# Patient Record
Sex: Female | Born: 1955 | Race: White | Hispanic: No | Marital: Married | Smoking: Former smoker
Health system: Southern US, Community
[De-identification: ages and names within clinical notes are randomized; demographics above are authoritative.]

## PROBLEM LIST (undated history)

## (undated) DIAGNOSIS — N951 Menopausal and female climacteric states: Secondary | ICD-10-CM

## (undated) DIAGNOSIS — C569 Malignant neoplasm of unspecified ovary: Secondary | ICD-10-CM

## (undated) DIAGNOSIS — N852 Hypertrophy of uterus: Secondary | ICD-10-CM

## (undated) DIAGNOSIS — D219 Benign neoplasm of connective and other soft tissue, unspecified: Secondary | ICD-10-CM

## (undated) DIAGNOSIS — T7840XA Allergy, unspecified, initial encounter: Secondary | ICD-10-CM

## (undated) DIAGNOSIS — K649 Unspecified hemorrhoids: Secondary | ICD-10-CM

## (undated) DIAGNOSIS — K5900 Constipation, unspecified: Secondary | ICD-10-CM

## (undated) DIAGNOSIS — K635 Polyp of colon: Secondary | ICD-10-CM

## (undated) HISTORY — DX: Polyp of colon: K63.5

## (undated) HISTORY — PX: POLYPECTOMY: SHX149

## (undated) HISTORY — PX: DILATION AND CURETTAGE OF UTERUS: SHX78

## (undated) HISTORY — DX: Hypertrophy of uterus: N85.2

## (undated) HISTORY — DX: Constipation, unspecified: K59.00

## (undated) HISTORY — DX: Benign neoplasm of connective and other soft tissue, unspecified: D21.9

## (undated) HISTORY — DX: Menopausal and female climacteric states: N95.1

## (undated) HISTORY — DX: Malignant neoplasm of unspecified ovary: C56.9

## (undated) HISTORY — PX: HAND SURGERY: SHX662

## (undated) HISTORY — DX: Unspecified hemorrhoids: K64.9

## (undated) HISTORY — PX: BREAST LUMPECTOMY: SHX2

## (undated) HISTORY — PX: ENDOMETRIAL ABLATION: SHX621

## (undated) HISTORY — PX: TONSILLECTOMY: SUR1361

## (undated) HISTORY — DX: Allergy, unspecified, initial encounter: T78.40XA

## (undated) HISTORY — PX: ABDOMINAL HYSTERECTOMY: SHX81

## (undated) HISTORY — PX: COLONOSCOPY: SHX174

---

## 2011-08-17 ENCOUNTER — Ambulatory Visit: Payer: Self-pay | Admitting: Obstetrics & Gynecology

## 2011-08-24 ENCOUNTER — Ambulatory Visit (INDEPENDENT_AMBULATORY_CARE_PROVIDER_SITE_OTHER): Payer: BC Managed Care – PPO | Admitting: Obstetrics & Gynecology

## 2011-08-24 ENCOUNTER — Encounter: Payer: Self-pay | Admitting: Obstetrics & Gynecology

## 2011-08-24 VITALS — BP 133/83 | HR 77 | Resp 18 | Ht 64.0 in | Wt 164.0 lb

## 2011-08-24 DIAGNOSIS — Z124 Encounter for screening for malignant neoplasm of cervix: Secondary | ICD-10-CM

## 2011-08-24 DIAGNOSIS — Z01419 Encounter for gynecological examination (general) (routine) without abnormal findings: Secondary | ICD-10-CM

## 2011-08-24 DIAGNOSIS — Z79890 Hormone replacement therapy (postmenopausal): Secondary | ICD-10-CM | POA: Insufficient documentation

## 2011-08-24 MED ORDER — ESTRADIOL 0.1 MG/24HR TD PTTW: 1.0000 | MEDICATED_PATCH | TRANSDERMAL | Status: DC

## 2011-08-24 MED ORDER — PROGESTERONE MICRONIZED 100 MG PO CAPS: 100.0000 mg | ORAL_CAPSULE | Freq: Every day | ORAL | Status: DC

## 2011-08-24 NOTE — Patient Instructions (Signed)
Preventive Care for Adults, Female A healthy lifestyle and preventive care can promote health and wellness. Preventive health guidelines for women include the following key practices.  A routine yearly physical is a good way to check with your caregiver about your health and preventive screening. It is a chance to share any concerns and updates on your health, and to receive a thorough exam.   Visit your dentist for a routine exam and preventive care every 6 months. Brush your teeth twice a day and floss once a day. Good oral hygiene prevents tooth decay and gum disease.   The frequency of eye exams is based on your age, health, family medical history, use of contact lenses, and other factors. Follow your caregiver's recommendations for frequency of eye exams.   Eat a healthy diet. Foods like vegetables, fruits, whole grains, low-fat dairy products, and lean protein foods contain the nutrients you need without too many calories. Decrease your intake of foods high in solid fats, added sugars, and salt. Eat the right amount of calories for you.Get information about a proper diet from your caregiver, if necessary.   Regular physical exercise is one of the most important things you can do for your health. Most adults should get at least 150 minutes of moderate-intensity exercise (any activity that increases your heart rate and causes you to sweat) each week. In addition, most adults need muscle-strengthening exercises on 2 or more days a week.   Maintain a healthy weight. The body mass index (BMI) is a screening tool to identify possible weight problems. It provides an estimate of body fat based on height and weight. Your caregiver can help determine your BMI, and can help you achieve or maintain a healthy weight.For adults 20 years and older:   A BMI below 18.5 is considered underweight.   A BMI of 18.5 to 24.9 is normal.   A BMI of 25 to 29.9 is considered overweight.   A BMI of 30 and above is  considered obese.   Maintain normal blood lipids and cholesterol levels by exercising and minimizing your intake of saturated fat. Eat a balanced diet with plenty of fruit and vegetables. Blood tests for lipids and cholesterol should begin at age 20 and be repeated every 5 years. If your lipid or cholesterol levels are high, you are over 50, or you are at high risk for heart disease, you may need your cholesterol levels checked more frequently.Ongoing high lipid and cholesterol levels should be treated with medicines if diet and exercise are not effective.   If you smoke, find out from your caregiver how to quit. If you do not use tobacco, do not start.   If you are pregnant, do not drink alcohol. If you are breastfeeding, be very cautious about drinking alcohol. If you are not pregnant and choose to drink alcohol, do not exceed 1 drink per day. One drink is considered to be 12 ounces (355 mL) of beer, 5 ounces (148 mL) of wine, or 1.5 ounces (44 mL) of liquor.   Avoid use of street drugs. Do not share needles with anyone. Ask for help if you need support or instructions about stopping the use of drugs.   High blood pressure causes heart disease and increases the risk of stroke. Your blood pressure should be checked at least every 1 to 2 years. Ongoing high blood pressure should be treated with medicines if weight loss and exercise are not effective.   If you are 55 to 54   years old, ask your caregiver if you should take aspirin to prevent strokes.   Diabetes screening involves taking a blood sample to check your fasting blood sugar level. This should be done once every 3 years, after age 45, if you are within normal weight and without risk factors for diabetes. Testing should be considered at a younger age or be carried out more frequently if you are overweight and have at least 1 risk factor for diabetes.   Breast cancer screening is essential preventive care for women. You should practice "breast  self-awareness." This means understanding the normal appearance and feel of your breasts and may include breast self-examination. Any changes detected, no matter how small, should be reported to a caregiver. Women in their 20s and 30s should have a clinical breast exam (CBE) by a caregiver as part of a regular health exam every 1 to 3 years. After age 40, women should have a CBE every year. Starting at age 40, women should consider having a mammography (breast X-ray test) every year. Women who have a family history of breast cancer should talk to their caregiver about genetic screening. Women at a high risk of breast cancer should talk to their caregivers about having magnetic resonance imaging (MRI) and a mammography every year.   The Pap test is a screening test for cervical cancer. A Pap test can show cell changes on the cervix that might become cervical cancer if left untreated. A Pap test is a procedure in which cells are obtained and examined from the lower end of the uterus (cervix).   Women should have a Pap test starting at age 21.   Between ages 21 and 29, Pap tests should be repeated every 2 years.   Beginning at age 30, you should have a Pap test every 3 years as long as the past 3 Pap tests have been normal.   Some women have medical problems that increase the chance of getting cervical cancer. Talk to your caregiver about these problems. It is especially important to talk to your caregiver if a new problem develops soon after your last Pap test. In these cases, your caregiver may recommend more frequent screening and Pap tests.   The above recommendations are the same for women who have or have not gotten the vaccine for human papillomavirus (HPV).   If you had a hysterectomy for a problem that was not cancer or a condition that could lead to cancer, then you no longer need Pap tests. Even if you no longer need a Pap test, a regular exam is a good idea to make sure no other problems are  starting.   If you are between ages 65 and 70, and you have had normal Pap tests going back 10 years, you no longer need Pap tests. Even if you no longer need a Pap test, a regular exam is a good idea to make sure no other problems are starting.   If you have had past treatment for cervical cancer or a condition that could lead to cancer, you need Pap tests and screening for cancer for at least 20 years after your treatment.   If Pap tests have been discontinued, risk factors (such as a new sexual partner) need to be reassessed to determine if screening should be resumed.   The HPV test is an additional test that may be used for cervical cancer screening. The HPV test looks for the virus that can cause the cell changes on the cervix.   The cells collected during the Pap test can be tested for HPV. The HPV test could be used to screen women aged 30 years and older, and should be used in women of any age who have unclear Pap test results. After the age of 30, women should have HPV testing at the same frequency as a Pap test.   Colorectal cancer can be detected and often prevented. Most routine colorectal cancer screening begins at the age of 50 and continues through age 75. However, your caregiver may recommend screening at an earlier age if you have risk factors for colon cancer. On a yearly basis, your caregiver may provide home test kits to check for hidden blood in the stool. Use of a small camera at the end of a tube, to directly examine the colon (sigmoidoscopy or colonoscopy), can detect the earliest forms of colorectal cancer. Talk to your caregiver about this at age 50, when routine screening begins. Direct examination of the colon should be repeated every 5 to 10 years through age 75, unless early forms of pre-cancerous polyps or small growths are found.   Hepatitis C blood testing is recommended for all people born from 1945 through 1965 and any individual with known risks for hepatitis C.    Practice safe sex. Use condoms and avoid high-risk sexual practices to reduce the spread of sexually transmitted infections (STIs). STIs include gonorrhea, chlamydia, syphilis, trichomonas, herpes, HPV, and human immunodeficiency virus (HIV). Herpes, HIV, and HPV are viral illnesses that have no cure. They can result in disability, cancer, and death. Sexually active women aged 25 and younger should be checked for chlamydia. Older women with new or multiple partners should also be tested for chlamydia. Testing for other STIs is recommended if you are sexually active and at increased risk.   Osteoporosis is a disease in which the bones lose minerals and strength with aging. This can result in serious bone fractures. The risk of osteoporosis can be identified using a bone density scan. Women ages 65 and over and women at risk for fractures or osteoporosis should discuss screening with their caregivers. Ask your caregiver whether you should take a calcium supplement or vitamin D to reduce the rate of osteoporosis.   Menopause can be associated with physical symptoms and risks. Hormone replacement therapy is available to decrease symptoms and risks. You should talk to your caregiver about whether hormone replacement therapy is right for you.   Use sunscreen with sun protection factor (SPF) of 30 or more. Apply sunscreen liberally and repeatedly throughout the day. You should seek shade when your shadow is shorter than you. Protect yourself by wearing long sleeves, pants, a wide-brimmed hat, and sunglasses year round, whenever you are outdoors.   Once a month, do a whole body skin exam, using a mirror to look at the skin on your back. Notify your caregiver of new moles, moles that have irregular borders, moles that are larger than a pencil eraser, or moles that have changed in shape or color.   Stay current with required immunizations.   Influenza. You need a dose every fall (or winter). The composition of  the flu vaccine changes each year, so being vaccinated once is not enough.   Pneumococcal polysaccharide. You need 1 to 2 doses if you smoke cigarettes or if you have certain chronic medical conditions. You need 1 dose at age 65 (or older) if you have never been vaccinated.   Tetanus, diphtheria, pertussis (Tdap, Td). Get 1 dose of   Tdap vaccine if you are younger than age 65, are over 65 and have contact with an infant, are a healthcare worker, are pregnant, or simply want to be protected from whooping cough. After that, you need a Td booster dose every 10 years. Consult your caregiver if you have not had at least 3 tetanus and diphtheria-containing shots sometime in your life or have a deep or dirty wound.   HPV. You need this vaccine if you are a woman age 26 or younger. The vaccine is given in 3 doses over 6 months.   Measles, mumps, rubella (MMR). You need at least 1 dose of MMR if you were born in 1957 or later. You may also need a second dose.   Meningococcal. If you are age 19 to 21 and a first-year college student living in a residence hall, or have one of several medical conditions, you need to get vaccinated against meningococcal disease. You may also need additional booster doses.   Zoster (shingles). If you are age 60 or older, you should get this vaccine.   Varicella (chickenpox). If you have never had chickenpox or you were vaccinated but received only 1 dose, talk to your caregiver to find out if you need this vaccine.   Hepatitis A. You need this vaccine if you have a specific risk factor for hepatitis A virus infection or you simply wish to be protected from this disease. The vaccine is usually given as 2 doses, 6 to 18 months apart.   Hepatitis B. You need this vaccine if you have a specific risk factor for hepatitis B virus infection or you simply wish to be protected from this disease. The vaccine is given in 3 doses, usually over 6 months.  Preventive Services /  Frequency Ages 19 to 39  Blood pressure check.** / Every 1 to 2 years.   Lipid and cholesterol check.** / Every 5 years beginning at age 20.   Clinical breast exam.** / Every 3 years for women in their 20s and 30s.   Pap test.** / Every 2 years from ages 21 through 29. Every 3 years starting at age 30 through age 65 or 70 with a history of 3 consecutive normal Pap tests.   HPV screening.** / Every 3 years from ages 30 through ages 65 to 70 with a history of 3 consecutive normal Pap tests.   Hepatitis C blood test.** / For any individual with known risks for hepatitis C.   Skin self-exam. / Monthly.   Influenza immunization.** / Every year.   Pneumococcal polysaccharide immunization.** / 1 to 2 doses if you smoke cigarettes or if you have certain chronic medical conditions.   Tetanus, diphtheria, pertussis (Tdap, Td) immunization. / A one-time dose of Tdap vaccine. After that, you need a Td booster dose every 10 years.   HPV immunization. / 3 doses over 6 months, if you are 26 and younger.   Measles, mumps, rubella (MMR) immunization. / You need at least 1 dose of MMR if you were born in 1957 or later. You may also need a second dose.   Meningococcal immunization. / 1 dose if you are age 19 to 21 and a first-year college student living in a residence hall, or have one of several medical conditions, you need to get vaccinated against meningococcal disease. You may also need additional booster doses.   Varicella immunization.** / Consult your caregiver.   Hepatitis A immunization.** / Consult your caregiver. 2 doses, 6 to 18 months   apart.   Hepatitis B immunization.** / Consult your caregiver. 3 doses usually over 6 months.  Ages 40 to 64  Blood pressure check.** / Every 1 to 2 years.   Lipid and cholesterol check.** / Every 5 years beginning at age 20.   Clinical breast exam.** / Every year after age 40.   Mammogram.** / Every year beginning at age 40 and continuing for as  long as you are in good health. Consult with your caregiver.   Pap test.** / Every 3 years starting at age 30 through age 65 or 70 with a history of 3 consecutive normal Pap tests.   HPV screening.** / Every 3 years from ages 30 through ages 65 to 70 with a history of 3 consecutive normal Pap tests.   Fecal occult blood test (FOBT) of stool. / Every year beginning at age 50 and continuing until age 75. You may not need to do this test if you get a colonoscopy every 10 years.   Flexible sigmoidoscopy or colonoscopy.** / Every 5 years for a flexible sigmoidoscopy or every 10 years for a colonoscopy beginning at age 50 and continuing until age 75.   Hepatitis C blood test.** / For all people born from 1945 through 1965 and any individual with known risks for hepatitis C.   Skin self-exam. / Monthly.   Influenza immunization.** / Every year.   Pneumococcal polysaccharide immunization.** / 1 to 2 doses if you smoke cigarettes or if you have certain chronic medical conditions.   Tetanus, diphtheria, pertussis (Tdap, Td) immunization.** / A one-time dose of Tdap vaccine. After that, you need a Td booster dose every 10 years.   Measles, mumps, rubella (MMR) immunization. / You need at least 1 dose of MMR if you were born in 1957 or later. You may also need a second dose.   Varicella immunization.** / Consult your caregiver.   Meningococcal immunization.** / Consult your caregiver.   Hepatitis A immunization.** / Consult your caregiver. 2 doses, 6 to 18 months apart.   Hepatitis B immunization.** / Consult your caregiver. 3 doses, usually over 6 months.  Ages 65 and over  Blood pressure check.** / Every 1 to 2 years.   Lipid and cholesterol check.** / Every 5 years beginning at age 20.   Clinical breast exam.** / Every year after age 40.   Mammogram.** / Every year beginning at age 40 and continuing for as long as you are in good health. Consult with your caregiver.   Pap test.** /  Every 3 years starting at age 30 through age 65 or 70 with a 3 consecutive normal Pap tests. Testing can be stopped between 65 and 70 with 3 consecutive normal Pap tests and no abnormal Pap or HPV tests in the past 10 years.   HPV screening.** / Every 3 years from ages 30 through ages 65 or 70 with a history of 3 consecutive normal Pap tests. Testing can be stopped between 65 and 70 with 3 consecutive normal Pap tests and no abnormal Pap or HPV tests in the past 10 years.   Fecal occult blood test (FOBT) of stool. / Every year beginning at age 50 and continuing until age 75. You may not need to do this test if you get a colonoscopy every 10 years.   Flexible sigmoidoscopy or colonoscopy.** / Every 5 years for a flexible sigmoidoscopy or every 10 years for a colonoscopy beginning at age 50 and continuing until age 75.   Hepatitis   C blood test.** / For all people born from 1945 through 1965 and any individual with known risks for hepatitis C.   Osteoporosis screening.** / A one-time screening for women ages 65 and over and women at risk for fractures or osteoporosis.   Skin self-exam. / Monthly.   Influenza immunization.** / Every year.   Pneumococcal polysaccharide immunization.** / 1 dose at age 65 (or older) if you have never been vaccinated.   Tetanus, diphtheria, pertussis (Tdap, Td) immunization. / A one-time dose of Tdap vaccine if you are over 65 and have contact with an infant, are a healthcare worker, or simply want to be protected from whooping cough. After that, you need a Td booster dose every 10 years.   Varicella immunization.** / Consult your caregiver.   Meningococcal immunization.** / Consult your caregiver.   Hepatitis A immunization.** / Consult your caregiver. 2 doses, 6 to 18 months apart.   Hepatitis B immunization.** / Check with your caregiver. 3 doses, usually over 6 months.  ** Family history and personal history of risk and conditions may change your caregiver's  recommendations. Document Released: 04/18/2001 Document Revised: 02/09/2011 Document Reviewed: 07/18/2010 [employer] Patient Information 2012 [employer]. 

## 2011-08-24 NOTE — Progress Notes (Signed)
Yearly exam was going to be scheduled in [place] for a robotic hysterectomy.  She is still wanting to do this but is waiting until her insurance is better.  She needs refills of her hormone medication today.

## 2011-08-24 NOTE — Progress Notes (Signed)
  Subjective:     Kerri Whitaker is a 56 y.o. female and is here for a comprehensive gynecologic physical exam.  She just moved to the area from [place] and is initiating GYN care. The patient reports no problems, has a history of fibroids and DUB which was treated with endometrial ablation several years ago.  She is on Vivelle Dot 0.1mg /day and is satisfied with it for control of vasomotor symptoms. No abnormal bleeding, vaginal discharge, problems with intercourse or any other gynecologic concerns.  The following portions of the patient's history were reviewed and updated as appropriate: allergies, current medications, past family history, past medical history, past social history, past surgical history and problem list.  Review of Systems A comprehensive review of systems was negative.   Objective:   Blood pressure 133/83, pulse 77, resp. rate 18, height 5\' 4"  (1.626 m), weight 164 lb (74.39 kg). GENERAL: Well-developed, well-nourished female in no acute distress. Multiple nevi (has been evaluated by Dermatology in [place]) HEENT: Normocephalic, atraumatic. Sclerae anicteric.  NECK: Supple. Normal thyroid.  LUNGS: Clear to auscultation bilaterally.  HEART: Regular rate and rhythm. BREASTS: Symmetric with everted nipples. No masses, skin changes, nipple drainage, or lymphadenopathy. ABDOMEN: Soft, nontender, nondistended. No organomegaly. PELVIC: Normal external female genitalia. Vagina is pink and rugated.  Normal discharge. Normal cervix contour. Pap smear obtained. Uterus is enlarged about 14 week size. No adnexal mass or tenderness.  EXTREMITIES: No cyanosis, clubbing, or edema, 2+ distal pulses.    Assessment:    Healthy female exam. Satisfied on Vivelle Dot    Plan:    Follow up pap Mammogram to be scheduled later Emphasized routine preventative health maintenance measures  HRT refilled

## 2012-04-15 ENCOUNTER — Telehealth: Payer: Self-pay | Admitting: *Deleted

## 2012-04-15 NOTE — Telephone Encounter (Signed)
Pharmacy called for a 3 month refill of medications.  OK to refill patient is not due to follow up until June.

## 2012-08-30 ENCOUNTER — Encounter: Payer: Self-pay | Admitting: Obstetrics & Gynecology

## 2012-08-30 ENCOUNTER — Encounter: Payer: Self-pay | Admitting: Internal Medicine

## 2012-08-30 ENCOUNTER — Ambulatory Visit (INDEPENDENT_AMBULATORY_CARE_PROVIDER_SITE_OTHER): Payer: Managed Care, Other (non HMO) | Admitting: Obstetrics & Gynecology

## 2012-08-30 VITALS — BP 129/84 | HR 82 | Resp 14 | Ht 64.0 in | Wt 168.0 lb

## 2012-08-30 DIAGNOSIS — Z124 Encounter for screening for malignant neoplasm of cervix: Secondary | ICD-10-CM

## 2012-08-30 DIAGNOSIS — Z01419 Encounter for gynecological examination (general) (routine) without abnormal findings: Secondary | ICD-10-CM

## 2012-08-30 DIAGNOSIS — Z0000 Encounter for general adult medical examination without abnormal findings: Secondary | ICD-10-CM

## 2012-08-30 DIAGNOSIS — Z1151 Encounter for screening for human papillomavirus (HPV): Secondary | ICD-10-CM

## 2012-08-30 MED ORDER — ESTRADIOL 1 MG PO TABS: 1.0000 mg | ORAL_TABLET | Freq: Every day | ORAL | Status: DC

## 2012-08-30 MED ORDER — MEDROXYPROGESTERONE ACETATE 2.5 MG PO TABS: 2.5000 mg | ORAL_TABLET | Freq: Every day | ORAL | Status: DC

## 2012-08-30 NOTE — Progress Notes (Signed)
Subjective:    Kerri Whitaker is a 57 y.o. female who presents for an annual exam. She would like to continue HRT, but in a cheaper form, costing her $110 per month.  She feels pelvic pressure from her fibroids. She was approved by her insurance co in [place] to have a RATH. She is still considering it. The patient is sexually active. GYN screening history: last pap: was normal. The patient wears seatbelts: yes. The patient participates in regular exercise: yes. Has the patient ever been transfused or tattooed?: no. The patient reports that there is not domestic violence in her life.   Menstrual History: OB History   Grav Para Term Preterm Abortions TAB SAB Ect Mult Living   1 1 1       1       Menarche age: 16 Coitarche: 16  No LMP recorded. Patient has had an ablation.    The following portions of the patient's history were reviewed and updated as appropriate: allergies, current medications, past family history, past medical history, past social history, past surgical history and problem list.  Review of Systems A comprehensive review of systems was negative. She works at [employer], data entry. Married for 26 1/2 year, no dyspareunia, no lubricant needed. Mammogram due. No colonoscopy.   Objective:    BP 129/84  Pulse 82  Resp 14  Ht 5\' 4"  (1.626 m)  Wt 168 lb (76.204 kg)  BMI 28.82 kg/m2  General Appearance:    Alert, cooperative, no distress, appears stated age  Head:    Normocephalic, without obvious abnormality, atraumatic  Eyes:    PERRL, conjunctiva/corneas clear, EOM's intact, fundi    benign, both eyes  Ears:    Normal TM's and external ear canals, both ears  Nose:   Nares normal, septum midline, mucosa normal, no drainage    or sinus tenderness  Throat:   Lips, mucosa, and tongue normal; teeth and gums normal  Neck:   Supple, symmetrical, trachea midline, no adenopathy;    thyroid:  no enlargement/tenderness/nodules; no carotid   bruit or JVD  Back:     Symmetric, no  curvature, ROM normal, no CVA tenderness  Lungs:     Clear to auscultation bilaterally, respirations unlabored  Chest Wall:    No tenderness or deformity   Heart:    Regular rate and rhythm, S1 and S2 normal, no murmur, rub   or gallop  Breast Exam:    No tenderness, masses, or nipple abnormality  Abdomen:     Soft, non-tender, bowel sounds active all four quadrants,    no masses, no organomegaly  Genitalia:    Normal female without lesion, discharge or tenderness, normal EG and vagina, uterus mobile and NT, 14 week size, normal adnexal exam     Extremities:   Extremities normal, atraumatic, no cyanosis or edema  Pulses:   2+ and symmetric all extremities  Skin:   Skin color, texture, turgor normal, no rashes or lesions  Lymph nodes:   Cervical, supraclavicular, and axillary nodes normal  Neurologic:   CNII-XII intact, normal strength, sensation and reflexes    throughout  .    Assessment:    Healthy female exam.    Plan:     Mammogram. Thin prep Pap smear. rec colonscopy

## 2012-08-30 NOTE — Addendum Note (Signed)
Addended by: Allie Bossier on: 08/30/2012 10:21 AM   Modules accepted: Orders

## 2012-09-10 ENCOUNTER — Encounter: Payer: Self-pay | Admitting: Gastroenterology

## 2012-09-12 LAB — HM MAMMOGRAPHY

## 2012-10-29 ENCOUNTER — Encounter: Payer: Managed Care, Other (non HMO) | Admitting: Internal Medicine

## 2012-11-05 ENCOUNTER — Ambulatory Visit (AMBULATORY_SURGERY_CENTER): Payer: Managed Care, Other (non HMO)

## 2012-11-05 VITALS — Ht 64.0 in | Wt 164.4 lb

## 2012-11-05 DIAGNOSIS — Z1211 Encounter for screening for malignant neoplasm of colon: Secondary | ICD-10-CM

## 2012-11-05 MED ORDER — NA SULFATE-K SULFATE-MG SULF 17.5-3.13-1.6 GM/177ML PO SOLN: 1.0000 | Freq: Once | ORAL | Status: DC

## 2012-11-06 ENCOUNTER — Encounter: Payer: Self-pay | Admitting: Gastroenterology

## 2012-11-18 ENCOUNTER — Encounter: Payer: Self-pay | Admitting: Gastroenterology

## 2012-11-18 ENCOUNTER — Ambulatory Visit (AMBULATORY_SURGERY_CENTER): Payer: Managed Care, Other (non HMO) | Admitting: Gastroenterology

## 2012-11-18 VITALS — BP 146/74 | HR 76 | Temp 97.1°F | Resp 23 | Ht 64.0 in | Wt 164.0 lb

## 2012-11-18 DIAGNOSIS — D126 Benign neoplasm of colon, unspecified: Secondary | ICD-10-CM

## 2012-11-18 DIAGNOSIS — D128 Benign neoplasm of rectum: Secondary | ICD-10-CM

## 2012-11-18 DIAGNOSIS — K648 Other hemorrhoids: Secondary | ICD-10-CM

## 2012-11-18 DIAGNOSIS — D129 Benign neoplasm of anus and anal canal: Secondary | ICD-10-CM

## 2012-11-18 DIAGNOSIS — Z1211 Encounter for screening for malignant neoplasm of colon: Secondary | ICD-10-CM

## 2012-11-18 MED ORDER — HYDROCORTISONE ACETATE 25 MG RE SUPP: 25.0000 mg | Freq: Two times a day (BID) | RECTAL | Status: DC

## 2012-11-18 MED ORDER — SODIUM CHLORIDE 0.9 % IV SOLN: 500.0000 mL | INTRAVENOUS | Status: DC

## 2012-11-18 NOTE — Progress Notes (Signed)
Called to room to assist during endoscopic procedure.  Patient ID and intended procedure confirmed with present staff. Received instructions for my participation in the procedure from the performing physician.  

## 2012-11-18 NOTE — Op Note (Signed)
[facility] [address] [place], [address]   COLONOSCOPY PROCEDURE REPORT  PATIENT: Kerri Whitaker, Kerri Whitaker  MR#: [id number] BIRTHDATE: [birth year] , 56  yrs. old GENDER: Female ENDOSCOPIST: Louis Meckel, MD REFERRED XB:JYNW Marice Potter, M.D. PROCEDURE DATE:  11/18/2012 PROCEDURE:   Colonoscopy with snare polypectomy and Colonoscopy with cold biopsy polypectomy First Screening Colonoscopy - Avg.  risk and is 50 yrs.  old or older Yes.  Prior Negative Screening - Now for repeat screening. N/A  History of Adenoma - Now for follow-up colonoscopy & has been > or = to 3 yrs.  N/A  Polyps Removed Today? Yes. ASA CLASS:   Class I INDICATIONS:average risk screening. MEDICATIONS: MAC sedation, administered by CRNA and propofol (Diprivan) 300mg  IV  DESCRIPTION OF PROCEDURE:   After the risks benefits and alternatives of the procedure were thoroughly explained, informed consent was obtained.  A digital rectal exam revealed external hemorrhoids.   The LB [id number]  endoscope was introduced through the anus and advanced to the cecum, which was identified by both the appendix and ileocecal valve. No adverse events experienced.   The quality of the prep was excellent using Suprep The instrument was then slowly withdrawn as the colon was fully examined.      COLON FINDINGS: A sessile polyp measuring 3 mm in size was found in the ascending colon.  A polypectomy was performed with a cold snare.  The resection was complete and the polyp tissue was completely retrieved.   Two sessile polyps measuring 2 mm in size were found in the distal sigmoid colon.  A polypectomy was performed with cold forceps.   A sessile polyp measuring 4 mm in size with a friable surface was found in the rectum.  A polypectomy was performed with a cold snare.  The resection was complete and the polyp tissue was completely retrieved.   Internal hemorrhoids were found.  Retroflexed views revealed no  abnormalities. The time to cecum=2 minutes 53 seconds.  Withdrawal time=14 minutes 18 seconds.  The scope was withdrawn and the procedure completed. COMPLICATIONS: There were no complications.  ENDOSCOPIC IMPRESSION: 1.   Sessile polyp measuring 3 mm in size was found in the ascending colon; polypectomy was performed with a cold snare 2.   Two sessile polyps measuring 2 mm in size were found in the distal sigmoid colon; polypectomy was performed with cold forceps 3.   Sessile polyp measuring 4 mm in size was found in the rectum; polypectomy was performed with a cold snare 4.   Internal hemorrhoids  RECOMMENDATIONS: If the polyp(s) removed today are proven to be adenomatous (pre-cancerous) polyps, you will need a colonoscopy in 3 years. Otherwise you should continue to follow colorectal cancer screening guidelines for "routine risk" patients with a colonoscopy in 10 years.  You will receive a letter within 1-2 weeks with the results of your biopsy as well as final recommendations.  Please call my office if you have not received a letter after 3 weeks. Anusol HC supp   eSigned:  Louis Meckel, MD 11/18/2012 9:08 AM   cc:   PATIENT NAME:  Shylynn, Bruning MR#: [id number]

## 2012-11-18 NOTE — Progress Notes (Signed)
Patient did not experience any of the following events: a burn prior to discharge; a fall within the facility; wrong site/side/patient/procedure/implant event; or a hospital transfer or hospital admission upon discharge from the facility. (G8907) Patient did not have preoperative order for IV antibiotic SSI prophylaxis. (G8918)  

## 2012-11-18 NOTE — Progress Notes (Signed)
Procedure ends, to recovery, report given to Zella Ball, RN and VSS.

## 2012-11-18 NOTE — Patient Instructions (Addendum)
Colon polyps x 4 removed today, and hemorrhoids seen. Handouts given on polyps and hemorrhoids. Repeat colonoscopy in 3 years. Resume current medications. May use Anusol HC suppositories as needed, RX sent to your pharmacy. Call us with any questions or concerns. Thank you!!  YOU HAD AN ENDOSCOPIC PROCEDURE TODAY AT THE [facility]: Refer to the procedure report that was given to you for any specific questions about what was found during the examination.  If the procedure report does not answer your questions, please call your gastroenterologist to clarify.  If you requested that your care partner not be given the details of your procedure findings, then the procedure report has been included in a sealed envelope for you to review at your convenience later.  YOU SHOULD EXPECT: Some feelings of bloating in the abdomen. Passage of more gas than usual.  Walking can help get rid of the air that was put into your GI tract during the procedure and reduce the bloating. If you had a lower endoscopy (such as a colonoscopy or flexible sigmoidoscopy) you may notice spotting of blood in your stool or on the toilet paper. If you underwent a bowel prep for your procedure, then you may not have a normal bowel movement for a few days.  DIET: Your first meal following the procedure should be a light meal and then it is ok to progress to your normal diet.  A half-sandwich or bowl of soup is an example of a good first meal.  Heavy or fried foods are harder to digest and may make you feel nauseous or bloated.  Likewise meals heavy in dairy and vegetables can cause extra gas to form and this can also increase the bloating.  Drink plenty of fluids but you should avoid alcoholic beverages for 24 hours.  ACTIVITY: Your care partner should take you home directly after the procedure.  You should plan to take it easy, moving slowly for the rest of the day.  You can resume normal activity the day after the procedure  however you should NOT DRIVE or use heavy machinery for 24 hours (because of the sedation medicines used during the test).    SYMPTOMS TO REPORT IMMEDIATELY: A gastroenterologist can be reached at any hour.  During normal business hours, 8:30 AM to 5:00 PM Monday through Friday, call [phone].  After hours and on weekends, please call the GI answering service at [phone] who will take a message and have the physician on call contact you.   Following lower endoscopy (colonoscopy or flexible sigmoidoscopy):  Excessive amounts of blood in the stool  Significant tenderness or worsening of abdominal pains  Swelling of the abdomen that is new, acute  Fever of 100F or higher  Following upper endoscopy (EGD)  Vomiting of blood or coffee ground material  New chest pain or pain under the shoulder blades  Painful or persistently difficult swallowing  New shortness of breath  Fever of 100F or higher  Black, tarry-looking stools  FOLLOW UP: If any biopsies were taken you will be contacted by phone or by letter within the next 1-3 weeks.  Call your gastroenterologist if you have not heard about the biopsies in 3 weeks.  Our staff will call the home number listed on your records the next business day following your procedure to check on you and address any questions or concerns that you may have at that time regarding the information given to you following your procedure. This is a Research officer, political party  call and so if there is no answer at the home number and we have not heard from you through the emergency physician on call, we will assume that you have returned to your regular daily activities without incident.  SIGNATURES/CONFIDENTIALITY: You and/or your care partner have signed paperwork which will be entered into your electronic medical record.  These signatures attest to the fact that that the information above on your After Visit Summary has been reviewed and is understood.  Full responsibility of  the confidentiality of this discharge information lies with you and/or your care-partner.

## 2012-11-19 ENCOUNTER — Telehealth: Payer: Self-pay | Admitting: *Deleted

## 2012-11-19 NOTE — Telephone Encounter (Signed)
  Follow up Call-  Call back number 11/18/2012  Post procedure Call Back phone  # [phone]  Permission to leave phone message Yes     Patient questions:  No answer.  No answering machine nor voice mail.

## 2012-11-22 ENCOUNTER — Encounter: Payer: Self-pay | Admitting: Gastroenterology

## 2013-01-08 ENCOUNTER — Ambulatory Visit: Payer: Managed Care, Other (non HMO) | Admitting: Family Medicine

## 2013-07-24 ENCOUNTER — Ambulatory Visit (INDEPENDENT_AMBULATORY_CARE_PROVIDER_SITE_OTHER): Payer: BC Managed Care – PPO | Admitting: Obstetrics & Gynecology

## 2013-07-24 ENCOUNTER — Encounter: Payer: Self-pay | Admitting: Obstetrics & Gynecology

## 2013-07-24 VITALS — BP 129/88 | HR 88 | Ht 64.0 in | Wt 171.4 lb

## 2013-07-24 DIAGNOSIS — Z0000 Encounter for general adult medical examination without abnormal findings: Secondary | ICD-10-CM

## 2013-07-24 DIAGNOSIS — Z01419 Encounter for gynecological examination (general) (routine) without abnormal findings: Secondary | ICD-10-CM

## 2013-07-24 DIAGNOSIS — Z124 Encounter for screening for malignant neoplasm of cervix: Secondary | ICD-10-CM

## 2013-07-24 DIAGNOSIS — Z1151 Encounter for screening for human papillomavirus (HPV): Secondary | ICD-10-CM

## 2013-07-24 MED ORDER — ESTRADIOL 1 MG PO TABS: 1.0000 mg | ORAL_TABLET | Freq: Every day | ORAL | Status: DC

## 2013-07-24 MED ORDER — MEDROXYPROGESTERONE ACETATE 2.5 MG PO TABS: 2.5000 mg | ORAL_TABLET | Freq: Every day | ORAL | Status: DC

## 2013-07-24 NOTE — Progress Notes (Signed)
Subjective:    Kerri Whitaker is a 58 y.o. female who presents for an annual exam. She feels an increase in pelvic pressure. She had a 14 week size fibroid uterus last year. She has to void more frequently and has constipation. She needs a refill of her meds. She says that her work checked her TSH last year but not her lipids. She has gained weight. The patient is sexually active. GYN screening history: last pap: was normal. The patient wears seatbelts: yes. The patient participates in regular exercise: yes. Has the patient ever been transfused or tattooed?: no. The patient reports that there is not domestic violence in her life.   Menstrual History: OB History   Grav Para Term Preterm Abortions TAB SAB Ect Mult Living   1 1 1       1       Menarche age: 15  No LMP recorded. Patient has had an ablation.    The following portions of the patient's history were reviewed and updated as appropriate: allergies, current medications, past family history, past medical history, past social history, past surgical history and problem list.  Review of Systems A comprehensive review of systems was negative. Married for 27 years, denies dyspareunia. She had a colonoscopy and adenomatous polyps were found. She will need another in 4 years.   Objective:    BP 129/88  Pulse 88  Ht 5\' 4"  (1.626 m)  Wt 171 lb 6.4 oz (77.747 kg)  BMI 29.41 kg/m2  General Appearance:    Alert, cooperative, no distress, appears stated age  Head:    Normocephalic, without obvious abnormality, atraumatic  Eyes:    PERRL, conjunctiva/corneas clear, EOM's intact, fundi    benign, both eyes  Ears:    Normal TM's and external ear canals, both ears  Nose:   Nares normal, septum midline, mucosa normal, no drainage    or sinus tenderness  Throat:   Lips, mucosa, and tongue normal; teeth and gums normal  Neck:   Supple, symmetrical, trachea midline, no adenopathy;    thyroid:  no enlargement/tenderness/nodules; no carotid   bruit or  JVD  Back:     Symmetric, no curvature, ROM normal, no CVA tenderness  Lungs:     Clear to auscultation bilaterally, respirations unlabored  Chest Wall:    No tenderness or deformity   Heart:    Regular rate and rhythm, S1 and S2 normal, no murmur, rub   or gallop  Breast Exam:    No tenderness, masses, or nipple abnormality  Abdomen:     Soft, non-tender, bowel sounds active all four quadrants,    no masses, no organomegaly  Genitalia:    Normal female without lesion, discharge or tenderness, 16-18 week size mobile, NT uterus, non-palpable adnexa (definite increase in size)     Extremities:   Extremities normal, atraumatic, no cyanosis or edema  Pulses:   2+ and symmetric all extremities  Skin:   Skin color, texture, turgor normal, no rashes or lesions  Lymph nodes:   Cervical, supraclavicular, and axillary nodes normal  Neurologic:   CNII-XII intact, normal strength, sensation and reflexes    throughout  .    Assessment:    Healthy female exam.  Icreasingly symptomatic fibroid uterus   Plan:     Breast self exam technique reviewed and patient encouraged to perform self-exam monthly. Mammogram. Thin prep Pap smear.  with cotesting I have offered her a TAH/BSO to be scheduled at her convenience.

## 2013-07-24 NOTE — Addendum Note (Signed)
Addended by: Lin Landsman C on: 07/24/2013 10:16 AM   Modules accepted: Orders

## 2013-07-25 LAB — LIPID PANEL
CHOLESTEROL: 146 mg/dL (ref 0–200)
HDL: 60 mg/dL (ref >39)
LDL Cholesterol: 49 mg/dL (ref 0–99)
TRIGLYCERIDES: 186 mg/dL — AB (ref <150)
Total CHOL/HDL Ratio: 2.4 Ratio
VLDL: 37 mg/dL (ref 0–40)

## 2013-07-29 ENCOUNTER — Telehealth: Payer: Self-pay | Admitting: *Deleted

## 2013-07-29 NOTE — Telephone Encounter (Signed)
Patient would like to go ahead with surgery and would like to do this early September if at all possible.

## 2013-09-09 ENCOUNTER — Telehealth: Payer: Self-pay | Admitting: *Deleted

## 2013-09-09 MED ORDER — MEDROXYPROGESTERONE ACETATE 2.5 MG PO TABS: 2.5000 mg | ORAL_TABLET | Freq: Every day | ORAL | Status: DC

## 2013-09-09 NOTE — Telephone Encounter (Signed)
Patients rx for progesterone did not [facility]  I have resent the prescription, she will call and check that it went through and let me know if further action needs to be taken.

## 2013-09-17 ENCOUNTER — Telehealth: Payer: Self-pay | Admitting: *Deleted

## 2013-09-17 NOTE — Telephone Encounter (Signed)
Patient would like to go ahead and schedule surgery.  She would like early September if at all possible.

## 2013-11-05 ENCOUNTER — Encounter (HOSPITAL_COMMUNITY)
Admission: RE | Admit: 2013-11-05 | Discharge: 2013-11-05 | Disposition: A | Discharge status: Home / Self Care | Payer: BC Managed Care – PPO | Source: Ambulatory Visit | Attending: Obstetrics & Gynecology | Admitting: Obstetrics & Gynecology

## 2013-11-05 ENCOUNTER — Encounter (HOSPITAL_COMMUNITY): Payer: Self-pay

## 2013-11-05 DIAGNOSIS — D259 Leiomyoma of uterus, unspecified: Secondary | ICD-10-CM | POA: Insufficient documentation

## 2013-11-05 DIAGNOSIS — Z01818 Encounter for other preprocedural examination: Secondary | ICD-10-CM | POA: Diagnosis present

## 2013-11-05 LAB — CBC
HCT: 41 % (ref 36.0–46.0)
Hemoglobin: 13.8 g/dL (ref 12.0–15.0)
MCH: 31 pg (ref 26.0–34.0)
MCHC: 33.7 g/dL (ref 30.0–36.0)
MCV: 92.1 fL (ref 78.0–100.0)
PLATELETS: 276 10*3/uL (ref 150–400)
RBC: 4.45 MIL/uL (ref 3.87–5.11)
RDW: 13 % (ref 11.5–15.5)
WBC: 8.3 10*3/uL (ref 4.0–10.5)

## 2013-11-05 NOTE — Patient Instructions (Signed)
   Your procedure is scheduled on: SEPT 8 AT 930AM  Enter through the Main Entrance of [facility] at: [place] up the phone at the desk and dial [phone] and inform us of your arrival.  Please call this number if you have any problems the morning of surgery: [phone]  Remember: Do not eat food after midnight: Do not drink clear liquids after:MIDNIGHT SEPT 7 Take these medicines the morning of surgery with a SIP OF WATER:  Do not wear jewelry, make-up, or FINGER nail polish No metal in your hair or on your body. Do not wear lotions, powders, perfumes.  You may wear deodorant.  Do not bring valuables to the hospital. Contacts, dentures or bridgework may not be worn into surgery.  Leave suitcase in the car. After Surgery it may be brought to your room. For patients being admitted to the hospital, checkout time is 11:00am the day of discharge.    Patients discharged on the day of surgery will not be allowed to drive home.

## 2013-11-07 ENCOUNTER — Encounter (HOSPITAL_COMMUNITY): Payer: Self-pay | Admitting: Pharmacist

## 2013-11-11 ENCOUNTER — Inpatient Hospital Stay (HOSPITAL_COMMUNITY): Payer: BC Managed Care – PPO

## 2013-11-11 ENCOUNTER — Inpatient Hospital Stay (HOSPITAL_COMMUNITY): Payer: BC Managed Care – PPO | Admitting: Anesthesiology

## 2013-11-11 ENCOUNTER — Observation Stay (HOSPITAL_COMMUNITY)
Admission: RE | Admit: 2013-11-11 | Discharge: 2013-11-12 | Disposition: A | Discharge status: Home / Self Care | Payer: BC Managed Care – PPO | Source: Ambulatory Visit | Attending: Obstetrics & Gynecology | Admitting: Obstetrics & Gynecology

## 2013-11-11 ENCOUNTER — Encounter (HOSPITAL_COMMUNITY): Payer: Self-pay

## 2013-11-11 ENCOUNTER — Encounter (HOSPITAL_COMMUNITY): Payer: BC Managed Care – PPO | Admitting: Anesthesiology

## 2013-11-11 ENCOUNTER — Encounter (HOSPITAL_COMMUNITY)
Admission: RE | Disposition: A | Discharge status: Home / Self Care | Payer: Self-pay | Source: Ambulatory Visit | Attending: Obstetrics & Gynecology

## 2013-11-11 DIAGNOSIS — D259 Leiomyoma of uterus, unspecified: Secondary | ICD-10-CM | POA: Insufficient documentation

## 2013-11-11 DIAGNOSIS — Z803 Family history of malignant neoplasm of breast: Secondary | ICD-10-CM | POA: Insufficient documentation

## 2013-11-11 DIAGNOSIS — Z0000 Encounter for general adult medical examination without abnormal findings: Secondary | ICD-10-CM | POA: Diagnosis not present

## 2013-11-11 DIAGNOSIS — Z9889 Other specified postprocedural states: Secondary | ICD-10-CM

## 2013-11-11 DIAGNOSIS — C569 Malignant neoplasm of unspecified ovary: Secondary | ICD-10-CM | POA: Diagnosis not present

## 2013-11-11 HISTORY — PX: BIOPSY: SHX5522

## 2013-11-11 HISTORY — PX: LAPAROTOMY: SHX154

## 2013-11-11 LAB — PREGNANCY, URINE: PREG TEST UR: NEGATIVE

## 2013-11-11 SURGERY — LAPAROTOMY, EXPLORATORY
Anesthesia: General | Site: Abdomen

## 2013-11-11 MED ORDER — PROMETHAZINE HCL 25 MG/ML IJ SOLN: 6.2500 mg | INTRAMUSCULAR | Status: DC | PRN

## 2013-11-11 MED ORDER — GLYCOPYRROLATE 0.2 MG/ML IJ SOLN
INTRAMUSCULAR | Status: DC | PRN
  Administered 2013-11-11: .2 mg via INTRAVENOUS
  Administered 2013-11-11: 0.6 mg via INTRAVENOUS

## 2013-11-11 MED ORDER — FENTANYL CITRATE 0.05 MG/ML IJ SOLN
INTRAMUSCULAR | Status: AC
  Filled 2013-11-11: qty 5

## 2013-11-11 MED ORDER — KETOROLAC TROMETHAMINE 30 MG/ML IJ SOLN: 15.0000 mg | Freq: Once | INTRAMUSCULAR | Status: DC | PRN

## 2013-11-11 MED ORDER — MIDAZOLAM HCL 2 MG/2ML IJ SOLN
INTRAMUSCULAR | Status: AC
  Filled 2013-11-11: qty 2

## 2013-11-11 MED ORDER — GLYCOPYRROLATE 0.2 MG/ML IJ SOLN
INTRAMUSCULAR | Status: AC
  Filled 2013-11-11: qty 3

## 2013-11-11 MED ORDER — ONDANSETRON HCL 4 MG/2ML IJ SOLN
4.0000 mg | Freq: Four times a day (QID) | INTRAMUSCULAR | Status: DC | PRN
  Administered 2013-11-11: 4 mg via INTRAVENOUS
  Filled 2013-11-11: qty 2

## 2013-11-11 MED ORDER — NEOSTIGMINE METHYLSULFATE 10 MG/10ML IV SOLN
INTRAVENOUS | Status: DC | PRN
  Administered 2013-11-11: 4 mg via INTRAVENOUS

## 2013-11-11 MED ORDER — BUPIVACAINE HCL (PF) 0.5 % IJ SOLN
INTRAMUSCULAR | Status: AC
  Filled 2013-11-11: qty 30

## 2013-11-11 MED ORDER — 0.9 % SODIUM CHLORIDE (POUR BTL) OPTIME
TOPICAL | Status: DC | PRN
  Administered 2013-11-11: 1000 mL

## 2013-11-11 MED ORDER — PROPOFOL 10 MG/ML IV BOLUS
INTRAVENOUS | Status: DC | PRN
  Administered 2013-11-11: 150 mg via INTRAVENOUS

## 2013-11-11 MED ORDER — DEXAMETHASONE SODIUM PHOSPHATE 10 MG/ML IJ SOLN
INTRAMUSCULAR | Status: AC
  Filled 2013-11-11: qty 1

## 2013-11-11 MED ORDER — CEFAZOLIN SODIUM-DEXTROSE 2-3 GM-% IV SOLR
2.0000 g | INTRAVENOUS | Status: AC
  Administered 2013-11-11: 2 g via INTRAVENOUS

## 2013-11-11 MED ORDER — SCOPOLAMINE 1 MG/3DAYS TD PT72
1.0000 | MEDICATED_PATCH | Freq: Once | TRANSDERMAL | Status: AC
  Administered 2013-11-11: 1.5 mg via TRANSDERMAL
  Administered 2013-11-11: 1 via TRANSDERMAL

## 2013-11-11 MED ORDER — HYDROMORPHONE HCL PF 1 MG/ML IJ SOLN
INTRAMUSCULAR | Status: AC
  Administered 2013-11-11: 0.5 mg via INTRAVENOUS
  Filled 2013-11-11: qty 1

## 2013-11-11 MED ORDER — IOHEXOL 300 MG/ML  SOLN
100.0000 mL | Freq: Once | INTRAMUSCULAR | Status: AC | PRN
  Administered 2013-11-11: 100 mL via INTRAVENOUS

## 2013-11-11 MED ORDER — LIDOCAINE HCL (CARDIAC) 20 MG/ML IV SOLN
INTRAVENOUS | Status: DC | PRN
  Administered 2013-11-11: 60 mg via INTRAVENOUS

## 2013-11-11 MED ORDER — BUPIVACAINE HCL (PF) 0.5 % IJ SOLN
INTRAMUSCULAR | Status: DC | PRN
  Administered 2013-11-11: 30 mL

## 2013-11-11 MED ORDER — CEFAZOLIN SODIUM-DEXTROSE 2-3 GM-% IV SOLR
INTRAVENOUS | Status: AC
  Filled 2013-11-11: qty 50

## 2013-11-11 MED ORDER — BUPIVACAINE HCL (PF) 0.25 % IJ SOLN
INTRAMUSCULAR | Status: AC
  Filled 2013-11-11: qty 30

## 2013-11-11 MED ORDER — SCOPOLAMINE 1 MG/3DAYS TD PT72
MEDICATED_PATCH | TRANSDERMAL | Status: AC
  Filled 2013-11-11: qty 1

## 2013-11-11 MED ORDER — KETOROLAC TROMETHAMINE 30 MG/ML IJ SOLN
INTRAMUSCULAR | Status: AC
  Filled 2013-11-11: qty 1

## 2013-11-11 MED ORDER — MEPERIDINE HCL 25 MG/ML IJ SOLN: 6.2500 mg | INTRAMUSCULAR | Status: DC | PRN

## 2013-11-11 MED ORDER — IOHEXOL 300 MG/ML  SOLN
50.0000 mL | INTRAMUSCULAR | Status: AC
  Administered 2013-11-11: 50 mL via ORAL

## 2013-11-11 MED ORDER — HYDROMORPHONE HCL PF 1 MG/ML IJ SOLN
0.2500 mg | INTRAMUSCULAR | Status: DC | PRN
  Administered 2013-11-11: 0.5 mg via INTRAVENOUS

## 2013-11-11 MED ORDER — PROPOFOL 10 MG/ML IV EMUL
INTRAVENOUS | Status: AC
  Filled 2013-11-11: qty 20

## 2013-11-11 MED ORDER — LIDOCAINE HCL (CARDIAC) 20 MG/ML IV SOLN
INTRAVENOUS | Status: AC
  Filled 2013-11-11: qty 5

## 2013-11-11 MED ORDER — HYDROMORPHONE HCL PF 1 MG/ML IJ SOLN
0.2000 mg | INTRAMUSCULAR | Status: DC | PRN
Start: 2013-11-11 — End: 2013-11-12
  Administered 2013-11-11: 0.6 mg via INTRAVENOUS
  Filled 2013-11-11: qty 1

## 2013-11-11 MED ORDER — FENTANYL CITRATE 0.05 MG/ML IJ SOLN
INTRAMUSCULAR | Status: DC | PRN
  Administered 2013-11-11 (×3): 50 ug via INTRAVENOUS
  Administered 2013-11-11: 100 ug via INTRAVENOUS

## 2013-11-11 MED ORDER — MIDAZOLAM HCL 2 MG/2ML IJ SOLN
INTRAMUSCULAR | Status: DC | PRN
  Administered 2013-11-11: 2 mg via INTRAVENOUS

## 2013-11-11 MED ORDER — MIDAZOLAM HCL 2 MG/2ML IJ SOLN: 0.5000 mg | Freq: Once | INTRAMUSCULAR | Status: DC | PRN

## 2013-11-11 MED ORDER — ONDANSETRON HCL 4 MG PO TABS: 4.0000 mg | ORAL_TABLET | Freq: Four times a day (QID) | ORAL | Status: DC | PRN

## 2013-11-11 MED ORDER — OXYCODONE-ACETAMINOPHEN 5-325 MG PO TABS
1.0000 | ORAL_TABLET | ORAL | Status: DC | PRN
  Administered 2013-11-11: 2 via ORAL
  Filled 2013-11-11: qty 2

## 2013-11-11 MED ORDER — LACTATED RINGERS IV SOLN
INTRAVENOUS | Status: DC
  Administered 2013-11-11 (×2): via INTRAVENOUS

## 2013-11-11 MED ORDER — NEOSTIGMINE METHYLSULFATE 10 MG/10ML IV SOLN
INTRAVENOUS | Status: AC
  Filled 2013-11-11: qty 1

## 2013-11-11 MED ORDER — ROCURONIUM BROMIDE 100 MG/10ML IV SOLN
INTRAVENOUS | Status: DC | PRN
  Administered 2013-11-11: 30 mg via INTRAVENOUS

## 2013-11-11 MED ORDER — KETOROLAC TROMETHAMINE 30 MG/ML IJ SOLN
INTRAMUSCULAR | Status: DC | PRN
  Administered 2013-11-11: 30 mg via INTRAVENOUS

## 2013-11-11 MED ORDER — ONDANSETRON HCL 4 MG/2ML IJ SOLN
INTRAMUSCULAR | Status: DC | PRN
  Administered 2013-11-11: 4 mg via INTRAVENOUS

## 2013-11-11 MED ORDER — ONDANSETRON HCL 4 MG/2ML IJ SOLN
INTRAMUSCULAR | Status: AC
  Filled 2013-11-11: qty 2

## 2013-11-11 MED ORDER — ROCURONIUM BROMIDE 100 MG/10ML IV SOLN
INTRAVENOUS | Status: AC
  Filled 2013-11-11: qty 1

## 2013-11-11 MED ORDER — DEXAMETHASONE SODIUM PHOSPHATE 10 MG/ML IJ SOLN
INTRAMUSCULAR | Status: DC | PRN
  Administered 2013-11-11: 5 mg via INTRAVENOUS

## 2013-11-11 MED ORDER — SODIUM BICARBONATE 8.4 % IV SOLN
INTRAVENOUS | Status: AC
  Filled 2013-11-11: qty 50

## 2013-11-11 MED ORDER — IBUPROFEN 800 MG PO TABS
800.0000 mg | ORAL_TABLET | Freq: Three times a day (TID) | ORAL | Status: DC | PRN
  Administered 2013-11-11 – 2013-11-12 (×2): 800 mg via ORAL
  Filled 2013-11-11 (×2): qty 1

## 2013-11-11 MED ORDER — ZOLPIDEM TARTRATE 5 MG PO TABS
5.0000 mg | ORAL_TABLET | Freq: Every evening | ORAL | Status: DC | PRN
  Administered 2013-11-11: 5 mg via ORAL
  Filled 2013-11-11: qty 1

## 2013-11-11 SURGICAL SUPPLY — 29 items
BLADE SURG 10 STRL SS (BLADE) ×6 IMPLANT
CANISTER SUCT 3000ML (MISCELLANEOUS) ×3 IMPLANT
CLOTH BEACON ORANGE TIMEOUT ST (SAFETY) ×3 IMPLANT
CONT PATH 16OZ SNAP LID 3702 (MISCELLANEOUS) ×3 IMPLANT
DRAPE CESAREAN BIRTH W POUCH (DRAPES) ×3 IMPLANT
DRAPE WARM FLUID 44X44 (DRAPE) ×3 IMPLANT
DRSG OPSITE POSTOP 4X10 (GAUZE/BANDAGES/DRESSINGS) ×3 IMPLANT
DURAPREP 26ML APPLICATOR (WOUND CARE) ×9 IMPLANT
GAUZE SPONGE 4X4 16PLY XRAY LF (GAUZE/BANDAGES/DRESSINGS) ×3 IMPLANT
GLOVE BIO SURGEON STRL SZ 6.5 (GLOVE) ×3 IMPLANT
GLOVE BIOGEL PI IND STRL 7.0 (GLOVE) ×4 IMPLANT
GLOVE BIOGEL PI INDICATOR 7.0 (GLOVE) ×2
GOWN STRL REUS W/TWL LRG LVL3 (GOWN DISPOSABLE) ×9 IMPLANT
NEEDLE SPNL 18GX3.5 QUINCKE PK (NEEDLE) ×3 IMPLANT
NS IRRIG 1000ML POUR BTL (IV SOLUTION) ×3 IMPLANT
PACK ABDOMINAL GYN (CUSTOM PROCEDURE TRAY) ×3 IMPLANT
PAD OB MATERNITY 4.3X12.25 (PERSONAL CARE ITEMS) ×3 IMPLANT
PROTECTOR NERVE ULNAR (MISCELLANEOUS) ×3 IMPLANT
SPONGE GAUZE 4X4 12PLY STER LF (GAUZE/BANDAGES/DRESSINGS) ×3 IMPLANT
SPONGE LAP 18X18 X RAY DECT (DISPOSABLE) ×6 IMPLANT
STRIP CLOSURE SKIN 1/2X4 (GAUZE/BANDAGES/DRESSINGS) ×3 IMPLANT
SUT VIC AB 0 CT1 36 (SUTURE) ×9 IMPLANT
SUT VIC AB 2-0 CT1 18 (SUTURE) ×9 IMPLANT
SUT VIC AB 3-0 CT1 27 (SUTURE) ×1
SUT VIC AB 3-0 CT1 TAPERPNT 27 (SUTURE) ×2 IMPLANT
SYR 20CC LL (SYRINGE) ×3 IMPLANT
TOWEL OR 17X24 6PK STRL BLUE (TOWEL DISPOSABLE) ×6 IMPLANT
TRAY FOLEY CATH 14FR (SET/KITS/TRAYS/PACK) ×3 IMPLANT
WATER STERILE IRR 1000ML POUR (IV SOLUTION) ×3 IMPLANT

## 2013-11-11 NOTE — Anesthesia Postprocedure Evaluation (Signed)
  Anesthesia Post-op Note  Patient: Kerri Whitaker  Procedure(s) Performed: Procedure(s) with comments: EXPLORATORY LAPAROTOMY (N/A) - with pelvic washings  BIOPSY (N/A) - Right Ovary  Patient Location: Women's Unit  Anesthesia Type:General  Level of Consciousness: awake  Airway and Oxygen Therapy: Patient Spontanous Breathing  Post-op Pain: mild  Post-op Assessment: Patient's Cardiovascular Status Stable and Respiratory Function Stable  Post-op Vital Signs: stable  Last Vitals:  Filed Vitals:   11/11/13 1350  BP: 151/73  Pulse: 79  Temp:   Resp: 18    Complications: No apparent anesthesia complications

## 2013-11-11 NOTE — Progress Notes (Signed)
11/11/13 1700  Clinical Encounter Type  Visited With Patient  Visit Type Spiritual support;Social support  Referral From Nurse  Consult/Referral To Chaplain  Spiritual Encounters  Spiritual Needs Emotional  Stress Factors  Patient Stress Factors Health changes;Loss of control   Visited with Kerri Whitaker to offer spiritual and emotional support after surgery.  She was using faith and love/support/prayers of family and friends to cope.  Per pt, she has lived in [place] for three years and has recently started attending a church closer to home, so she has close relationships with two local churches, as well as with her former church in [place] (23-year history).  She is working to balance hope, realism, and unknowing, naming what she looks forward to, how her faith and relationship with God help her, that many family members have died of cancer (not ovarian).  We explored what it is like for her to be in a waiting place after the unexpected findings of her surgery.  Provided pastoral presence, spiritual companionship, witness to her story, encouragement, affirmation, and prayer.  Also brought [name] a prayer shawl and handmade fleece heart as tangible signs of comfort and support, for which she verbalized gratitude.  [place] will continue to follow, but please also page as needed.  Thank you.  [place], [place]

## 2013-11-11 NOTE — Transfer of Care (Signed)
Immediate Anesthesia Transfer of Care Note  Patient: Kerri Whitaker  Procedure(s) Performed: Procedure(s) with comments: EXPLORATORY LAPAROTOMY (N/A) - with pelvic washings  BIOPSY - Right Ovary  Patient Location: PACU  Anesthesia Type:General  Level of Consciousness: awake, alert  and oriented  Airway & Oxygen Therapy: Patient Spontanous Breathing and Patient connected to nasal cannula oxygen  Post-op Assessment: Report given to PACU RN, Post -op Vital signs reviewed and stable and Patient moving all extremities X 4  Post vital signs: Reviewed and stable  Complications: No apparent anesthesia complications

## 2013-11-11 NOTE — Anesthesia Postprocedure Evaluation (Signed)
Anesthesia Post Note  Patient: Kerri Whitaker  Procedure(s) Performed: Procedure(s) (LRB): EXPLORATORY LAPAROTOMY (N/A) BIOPSY (N/A)  Anesthesia type: GA  Patient location: PACU  Post pain: Pain level controlled  Post assessment: Post-op Vital signs reviewed  Last Vitals:  Filed Vitals:   11/11/13 1115  BP: 122/64  Pulse: 65  Temp:   Resp: 8    Post vital signs: Reviewed  Level of consciousness: sedated  Complications: No apparent anesthesia complications   * see surgeon notes for intraop changes

## 2013-11-11 NOTE — Addendum Note (Signed)
Addendum created 11/11/13 1541 by Ignacia Bayley, CRNA   Modules edited: Notes Section   Notes Section:  File: [id number]

## 2013-11-11 NOTE — Addendum Note (Signed)
Addendum created 11/11/13 1200 by Lyda Jester, CRNA   Modules edited: Anesthesia Medication Administration

## 2013-11-11 NOTE — H&P (Signed)
Kerri Whitaker is an 58 y.o. female MW P1 (26 yo daughter) here today for a TAH/BSO due to her increasingly symptomatic fibroids. She describes a lot of pelvic pressures. She had an ablation about 10 years ago so bleeding is not an issue. However, pelvic pain is. Some extreme pressure with sex. She battles constipation.  Pertinent Gynecological History: Menses: none Bleeding: none Contraception: none DES exposure: denies Blood transfusions: none Sexually transmitted diseases: no past history Previous GYN Procedures: ablation  Last mammogram: normal Date:  Last pap: normal Date:    Menstrual History: Menarche age: 30 No LMP recorded. Patient has had an ablation.    Past Medical History  Diagnosis Date  . Fibroids   . Post menopausal syndrome   . Enlarged uterus   . Hemorrhoids     Past Surgical History  Procedure Laterality Date  . Breast lumpectomy      20 years ago. benign / right  . Hand surgery      tissue removed  . Dilation and curettage of uterus      x3  . Endometrial ablation    . Tonsillectomy      Family History  Problem Relation Age of Onset  . Cancer Mother 52    Breast  . Breast cancer Mother   . Lung cancer Mother   . Cancer Paternal Grandfather     stomach  . Cancer Brother 36    brain  . Cancer Brother 62    lung/brain  . Cancer Father 1    lung/kidney    Social History:  reports that she has never smoked. She has never used smokeless tobacco. She reports that she drinks about 1.2 ounces of alcohol per week. She reports that she does not use illicit drugs.  Allergies:  Allergies  Allergen Reactions  . Shellfish Allergy Anaphylaxis  . Band-Aid Liquid Bandage [New Skin]     Regular band-aids- removes skin, redness  . Codeine Nausea And Vomiting    Prescriptions prior to admission  Medication Sig Dispense Refill  . diphenhydrAMINE (BENADRYL) 25 MG tablet Take 25 mg by mouth at bedtime.       Marland Kitchen estradiol (ESTRACE) 1 MG tablet Take 1  tablet (1 mg total) by mouth daily.  90 tablet  6  . loratadine (CLARITIN) 10 MG tablet Take 10 mg by mouth daily as needed for allergies.       . medroxyPROGESTERone (PROVERA) 2.5 MG tablet Take 1 tablet (2.5 mg total) by mouth daily.  90 tablet  6  . polyethylene glycol (MIRALAX / GLYCOLAX) packet Take 17 g by mouth daily.      . psyllium (METAMUCIL) 58.6 % powder Take 1 packet by mouth daily.        ROS  Blood pressure 120/76, pulse 77, temperature 97.9 F (36.6 C), resp. rate 22, SpO2 98.00%. Physical Exam Heart- rrr Lungs- CTAB Abd- benign  Results for orders placed during the hospital encounter of 11/11/13 (from the past 24 hour(s))  PREGNANCY, URINE     Status: None   Collection Time    11/11/13  8:28 AM      Result Value Ref Range   Preg Test, Ur NEGATIVE  NEGATIVE    No results found.  Assessment/Plan: Symptomatic/painful fibroids- plan for TAH/BSO.  She understands the risks of surgery, including, but not to infection, bleeding, DVTs, damage to bowel, bladder, ureters. She wishes to proceed.     Desmund Elman C. 11/11/2013, 9:43 AM

## 2013-11-11 NOTE — Anesthesia Preprocedure Evaluation (Signed)
Anesthesia Evaluation  Patient identified by MRN, date of birth, ID band Patient awake    Reviewed: Allergy & Precautions, H&P , Patient's Chart, lab work & pertinent test results, reviewed documented beta blocker date and time   History of Anesthesia Complications Negative for: history of anesthetic complications  Airway Mallampati: II TM Distance: >3 FB Neck ROM: full    Dental   Pulmonary  breath sounds clear to auscultation        Cardiovascular Exercise Tolerance: Good Rhythm:regular Rate:Normal     Neuro/Psych    GI/Hepatic   Endo/Other    Renal/GU      Musculoskeletal   Abdominal   Peds  Hematology   Anesthesia Other Findings   Reproductive/Obstetrics                           Anesthesia Physical Anesthesia Plan  ASA: I  Anesthesia Plan: General ETT   Post-op Pain Management:    Induction:   Airway Management Planned:   Additional Equipment:   Intra-op Plan:   Post-operative Plan:   Informed Consent: I have reviewed the patients History and Physical, chart, labs and discussed the procedure including the risks, benefits and alternatives for the proposed anesthesia with the patient or authorized representative who has indicated his/her understanding and acceptance.   Dental Advisory Given  Plan Discussed with: CRNA and Surgeon  Anesthesia Plan Comments:         Anesthesia Quick Evaluation

## 2013-11-11 NOTE — Op Note (Signed)
11/11/2013  11:09 AM  PATIENT:  Kerri Whitaker  58 y.o. female  PRE-OPERATIVE DIAGNOSIS:  Symptomatic Firoid Uterus  POST-OPERATIVE DIAGNOSIS: fibroids and presumptive ovarian cancer  PROCEDURE:  Procedure(s) with comments: EXPLORATORY LAPAROTOMY - with pelvic washings  BIOPSY - Right Ovary  SURGEON:  Surgeon(s) and Role:    * Emily Filbert, MD - Primary    * Lavonia Drafts, MD - Assisting  ANESTHESIA:   general  EBL:  Total I/O In: 1200 [I.V.:1200] Out: 31 [Urine:50; Blood:25]  BLOOD ADMINISTERED:none  DRAINS: none   LOCAL MEDICATIONS USED:  MARCAINE     SPECIMEN:  Source of Specimen:  pelvic washings and biopsy of right ovary  DISPOSITION OF SPECIMEN:  PATHOLOGY  COUNTS:  YES  TOURNIQUET:  * No tourniquets in log *  DICTATION: .Dragon Dictation  PLAN OF CARE: Admit for overnight observation  PATIENT DISPOSITION:  PACU - hemodynamically stable.   Delay start of Pharmacological VTE agent (>24hrs) due to surgical blood loss or risk of bleeding: not applicable  The risks, benefits, and alternatives of surgery were explained, understood, accepted. Consents were signed. She was taken to the operating room general anesthesia was applied without complication. Her abdomen and vagina were prepped and draped in the usual sterile fashion, taking care to avoid the use of products containing iodine. A Foley catheter was placed in the draining clear urine throughout the case. I injected 30 cc of 0.5% Marcaine in the subcutaneous tissue approximately 2 cm above the symphysis pubis. The incision at the site and carried the incision down through the subcutaneous tissue to the fascia. The fascia was scored in the midline. The fascial incision was extended bilaterally with curved Mayo scissors. The middle 50% of the rectus muscles were separated in transverse fashion using electrosurgical technique. Excellent hemostasis was noted. The peritoneum was entered with hemostats a moderate to  large amount of clear fluid immediately came out of the peritoneal incision by looking into the pelvis I was able to see what appeared to be ovarian cancer covering both ovaries. By feeling inwards I was able to feel that tumors were also on the anterior abdominal wall and the posterior cul-de-sac was for the most part obliterated. I obtain pelvic washings and then called the GYN oncologist, Dr. Everitt Amber. She advised obtaining a biopsy of the cancer and closed and the patient up. I obtained a biopsy of the right ovarian cancer and no bleeding was noted. I closed the fascia with a 0 Vicryl running nonlocking suture. The peritoneum was irrigated cleaned and dried. Was noted to be hemostatic. I did a subcuticular closure with a 3-0 Vicryl suture. She was extubated and taken to recovery room in stable condition.

## 2013-11-11 NOTE — Consult Note (Signed)
Consult Note: Gyn-Onc  Consult was requested by Dr. Hulan Whitaker for the evaluation of Kerri Whitaker 58 y.o. female with clinical stage IIIC ovarian cancer  CC: clinical stage IIIC ovarian cancer recognized at the time of surgery  Assessment/Plan:  [name]  is a 58 y.o.  year old with apparent stage IIIC ovarian cancer. I performed a history, physical examination, and personally reviewed the patient's imaging films including the CT of the chest, abdomen and pelvis from today. We will followup her biopsies and cytology from today.  If this confirms epithelial ovarian cancer I am recommending 3 cycles of neoadjuvant chemotherapy followed by interval debulking (possibly minimally invasively) followed by additional chemotherapy. I had a lengthy discussion (45 minutes in face to face counseling) in which we discussed the management options for ovarian cancer, the importance of both chemotherapy and surgery, the prognosis for advanced disease, and the liklihood of response to converntional therapy. She understands that we will make arrangements for her to see Medical Oncologist, Dr Kerri Whitaker as soon as possible to facilitate therapy.  She has a particularly strong family history for malignancy, and with this, in particular her personal history of ovarian cancer and her mother's history of breast cancer, suggest the need for consultation with genetics counseling and consideration for BRCA deleterious mutations.  HPI: Kerri Whitaker is a 18 year G1P1 who is seen in consultation at the request of Dr Kerri Whitaker for apparent/clinical stage IIIC ovarian cancer.  Ms Kerri Whitaker has a remote history of uterine fibroids with 3 prior hysteroscopic procedures (>3 years ago) for myomectomy and ablation. Her last menstrual period was 3 years ago. She underwent a pelvic exam (as part of a routine pelvic examination) 1 year ago, and was diagnosed with a moderately enlarged fibroid uterus. She presented to Dr Kerri Whitaker in May, 2015 for  repeat annual exam, and reported increasing abdominal girth,abdominal bloating, weight gain and early satiety. She reports these symptoms for approximately 6 months. Of note, approximately 2 years ago she underwent colonoscopy and was noted to have 3 adenomatous polyps (which were removed).  Today (11/11/13) she underwent exploratory laparotomy with Dr Kerri Whitaker via a low transverse incision for presumed enlarging fibroids. Intraoperative findings included: "a moderate to large amount of clear fluid immediately came out of the peritoneal incision by looking into the pelvis I was able to see what appeared to be ovarian cancer covering both ovaries. By feeling inwards I was able to feel that tumors were also on the anterior abdominal wall and the posterior cul-de-sac was for the most part obliterated." The surgery was aborted and the patient was transferred to the inpatient ward for recovery.  A CA 125 was drawn. A CT of the chest abdomen and pelvis was obtained (11/11/13) which revealed: Bilateral complex cystic and solid adnexal masses, right side larger than left, consistent with ovarian carcinoma. 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 Mild left paraaortic retroperitoneal lymphadenopathy, suspicious for metastatic disease. No definite evidence of metastatic disease within the thorax.  Interval History: She is recovering well without nausea and with minimal pain.  Current Meds:  Current facility-administered medications:HYDROmorphone (DILAUDID) injection 0.2-0.6 mg, 0.2-0.6 mg, Intravenous, Q2H PRN, Kerri Filbert, MD, 0.6 mg at 11/11/13 1454;  ibuprofen (ADVIL,MOTRIN) tablet 800 mg, 800 mg, Oral, Q8H PRN, Kerri Filbert, MD, 800 mg at 11/11/13 2108;  ondansetron (ZOFRAN) injection 4 mg, 4 mg, Intravenous, Q6H PRN, Kerri Filbert, MD, 4 mg at 11/11/13 1951;  ondansetron (  ZOFRAN) tablet 4 mg, 4 mg, Oral, Q6H PRN, Kerri C Dove, MD oxyCODONE-acetaminophen (PERCOCET/ROXICET)  5-325 MG per tablet 1-2 tablet, 1-2 tablet, Oral, Q4H PRN, Kerri C Dove, MD, 2 tablet at 11/11/13 1850;  zolpidem (AMBIEN) tablet 5 mg, 5 mg, Oral, QHS PRN, Kerri McEachern, MD, 5 mg at 11/11/13 2211  Allergy:  Allergies  Allergen Reactions  . Shellfish Allergy Anaphylaxis  . Band-Aid Liquid Bandage [New Skin]     Regular band-aids- removes skin, redness  . Codeine Nausea And Vomiting    Social Hx:   History   Social History  . Marital Status: Married    Spouse Name: N/A    Number of Children: N/A  . Years of Education: N/A   Occupational History  . Not on file.   Social History Main Topics  . Smoking status: Never Smoker   . Smokeless tobacco: Never Used  . Alcohol Use: 1.2 oz/week    2 Glasses of wine per week  . Drug Use: No  . Sexual Activity: Yes    Partners: Male    Birth Control/ Protection: None     Comment: tubal blockage confirmed by hystosalpingogram/tn   Other Topics Concern  . Not on file   Social History Narrative  . No narrative on file    Past Surgical Hx:  Past Surgical History  Procedure Laterality Date  . Breast lumpectomy      20 years ago. benign / right  . Hand surgery      tissue removed  . Dilation and curettage of uterus      x3  . Endometrial ablation    . Tonsillectomy      Past Medical Hx:  Past Medical History  Diagnosis Date  . Fibroids   . Post menopausal syndrome   . Enlarged uterus   . Hemorrhoids     Past Gynecological History:  Uterine fibroids, G1 (SVD) with subsequent secondary infertilty and tubal occlusion documented on HSG. 3 prior hysteroscopic myomectomies, and endometrial ablation 5 years ago. LMP 3 years ago (2012). No LMP recorded. Patient has had an ablation.  Family Hx:  Family History  Problem Relation Age of Onset  . Cancer Mother 65    Breast  . Breast cancer Mother   . Lung cancer Mother   . Cancer Paternal Grandfather     stomach  . Cancer Brother 42    brain  . Cancer Brother 62     lung/brain  . Cancer Father 48    lung/kidney    Review of Systems:  Constitutional  Feels pain postop  ENT Normal appearing ears and nares bilaterally Skin/Breast  No rash, sores, jaundice, itching, dryness Cardiovascular  No chest pain, shortness of breath, or edema  Pulmonary  No cough or wheeze.  Gastro Intestinal  No nausea, vomitting, or diarrhoea. No bright red blood per rectum, no abdominal pain, change in bowel movement, + intermittent constipation. + bloating and abdominal distension Genito Urinary  No frequency, urgency, dysuria, no bleeding. See HPI Musculo Skeletal  No myalgia, arthralgia, joint swelling or pain  Neurologic  No weakness, numbness, change in gait,  Psychology  No depression, anxiety, insomnia.   Vitals:  Blood pressure 139/69, pulse 89, temperature 98.6 F (37 C), temperature source Oral, resp. rate 18, height 5' 4" (1.626 m), weight 168 lb (76.204 kg), SpO2 99.00%.  Physical Exam: WD in NAD Neck  Supple NROM, without any enlargements.  Lymph Node Survey No cervical supraclavicular or inguinal adenopathy Cardiovascular    Pulse normal rate, regularity and rhythm. S1 and S2 normal.  Lungs  Clear to auscultation bilateraly, without wheezes/crackles/rhonchi. Good air movement.  Skin  No rash/lesions/breakdown  Psychiatry  Alert and oriented to person, place, and time  Abdomen  Normoactive bowel sounds, abdomen soft, non-tender and overweight without evidence of hernia. Low transverse incision with bandage in place. Back No CVA tenderness Genito Urinary: Deferred. Rectal:  deferred.  Extremities  No bilateral cyanosis, clubbing or edema.  Donaciano Eva, MD  11/11/2013, 10:27 PM

## 2013-11-12 ENCOUNTER — Other Ambulatory Visit: Payer: Self-pay | Admitting: *Deleted

## 2013-11-12 ENCOUNTER — Encounter (HOSPITAL_COMMUNITY): Payer: Self-pay | Admitting: Obstetrics & Gynecology

## 2013-11-12 DIAGNOSIS — C569 Malignant neoplasm of unspecified ovary: Secondary | ICD-10-CM

## 2013-11-12 LAB — CBC
HEMATOCRIT: 37.3 % (ref 36.0–46.0)
HEMOGLOBIN: 12.8 g/dL (ref 12.0–15.0)
MCH: 31.3 pg (ref 26.0–34.0)
MCHC: 34.3 g/dL (ref 30.0–36.0)
MCV: 91.2 fL (ref 78.0–100.0)
Platelets: 271 10*3/uL (ref 150–400)
RBC: 4.09 MIL/uL (ref 3.87–5.11)
RDW: 12.8 % (ref 11.5–15.5)
WBC: 13.9 10*3/uL — AB (ref 4.0–10.5)

## 2013-11-12 MED ORDER — IBUPROFEN 800 MG PO TABS: 800.0000 mg | ORAL_TABLET | Freq: Three times a day (TID) | ORAL | Status: DC | PRN

## 2013-11-12 MED ORDER — ZOLPIDEM TARTRATE 5 MG PO TABS: 5.0000 mg | ORAL_TABLET | Freq: Every evening | ORAL | Status: DC | PRN

## 2013-11-12 NOTE — Progress Notes (Signed)
Discharge teaching complete. Pt understood all information and did not have any questions. Pt ambulated out of the hospital and discharged home to family.  

## 2013-11-12 NOTE — Progress Notes (Signed)
Shanica was coping well today and reported that she felt blessed with love and support from family, friends, and staff members here at the hospital.    Her husband, Dominica Severin and her sister, were in shock still and were having a difficult time waiting until they hear about their next move.  I let them know that we continue to be here for all of them and that Chaplain Lorrin Jackson will also be available to see them at the [facility].    Orra was using prayer and faith to help her with this difficult diagnosis.  I helped to provide witness to her story and emotional support to her family.  [employer] Pager, [phone] 2:56 PM   11/12/13 1400  Clinical Encounter Type  Visited With Patient;Patient and family together  Visit Type Spiritual support  Referral From Nurse;Chaplain  Consult/Referral To Chaplain (Chaplain Lorrin Jackson at [facility])  Spiritual Encounters  Spiritual Needs Emotional

## 2013-11-12 NOTE — Progress Notes (Signed)
Post discharge chart review completed.  

## 2013-11-12 NOTE — Discharge Summary (Signed)
Physician Discharge Summary  Patient ID: Kerri Whitaker MRN: [id number] DOB/AGE: 1955-06-11 58 y.o.  Admit date: 11/11/2013 Discharge date: 11/12/2013  Admission Diagnoses: symptomatic fibroids  Discharge Diagnoses: same + probable Stage 3C ovarian cancer Active Problems:   Post-operative state   Discharged Condition: good  Hospital Course: She was taken to the OR where a laparatomy incision was made and a moderate amount of ascitic fluid was noted along with probable ovarian cancer on both ovaries. A sample of the cancer was sent to pathology. An intraoperative consult with Dr. Denman George was done via the telephone. The incision was closed. Post operatively Kijana did very well. She was tolerating po well, having flatus, and voiding without dysuria. She voiced her readiness to go home. Dr. Denman George spoke with the patient on the day of surgery after her pelvis and chest CTs were finished. Bethzy will have an appt with Dr. Myrla Halsted in the near future.  Consults: as above  Significant Diagnostic Studies: pathology pending  Treatments: surgery: as above  Discharge Exam: Blood pressure 126/64, pulse 78, temperature 98.5 F (36.9 C), temperature source Oral, resp. rate 18, height 5\' 4"  (1.626 m), weight 168 lb (76.204 kg), SpO2 98.00%. General appearance: alert Resp: clear to auscultation bilaterally Cardio: regular rate and rhythm, S1, S2 normal, no murmur, click, rub or gallop GI: soft, non-tender; bowel sounds normal; no masses,  no organomegaly Incision/Wound: c/d/i  Disposition: Final discharge disposition not confirmed     Medication List    STOP taking these medications       diphenhydrAMINE 25 MG tablet  Commonly known as:  BENADRYL      TAKE these medications       estradiol 1 MG tablet  Commonly known as:  ESTRACE  Take 1 tablet (1 mg total) by mouth daily.     ibuprofen 800 MG tablet  Commonly known as:  ADVIL,MOTRIN  Take 1 tablet (800 mg total) by mouth every 8 (eight)  hours as needed (mild pain).     loratadine 10 MG tablet  Commonly known as:  CLARITIN  Take 10 mg by mouth daily as needed for allergies.     medroxyPROGESTERone 2.5 MG tablet  Commonly known as:  PROVERA  Take 1 tablet (2.5 mg total) by mouth daily.     polyethylene glycol packet  Commonly known as:  MIRALAX / GLYCOLAX  Take 17 g by mouth daily.     psyllium 58.6 % powder  Commonly known as:  METAMUCIL  Take 1 packet by mouth daily.     zolpidem 5 MG tablet  Commonly known as:  AMBIEN  Take 1 tablet (5 mg total) by mouth at bedtime as needed for sleep.           Follow-up Information   Follow up with Kathleen Tamm C., MD In 6 weeks.   Specialty:  Obstetrics and Gynecology   Contact information:   [place] [address] [place]! Signed: Arthur Speagle C. 11/12/2013, 1:33 PM

## 2013-11-13 ENCOUNTER — Telehealth: Payer: Self-pay | Admitting: Oncology

## 2013-11-13 ENCOUNTER — Telehealth: Payer: Self-pay | Admitting: Gynecologic Oncology

## 2013-11-13 NOTE — Telephone Encounter (Signed)
I called Kerri Whitaker to inform her of the pathology findings most consistent with low malignant potential ovarian tumor. Based on CT findings and Dr Alease Medina assessment it appears to be metastatic (stage III). We discussed why LMP tumors are not best treated with chemotherapy (poor response) and the importance of surgery to debulk these, ideally in their entirety. I explained that on final pathology of her large specimens there may be a small focus of invasive tumor found, and if this is the case, we may recommend chemotherapy after surgery. However, at this time, there is no role for neoadjuvant chemotherapy.  I explained that this surgery can best and soonest be accomodated at [facility] with my partner Dr Janie Morning. I explained that she will likely require a laparotomy (vertical) with TAH, BSO, omentectomy, lymphadenectomy, debulking and possible bowel resection.   We will facilitate this as soon as possible and notify her with the OR date and preoeprative appointment times.  Donaciano Eva, MD

## 2013-11-13 NOTE — Telephone Encounter (Signed)
C/D 11/13/13 for appt.9/14

## 2013-11-14 ENCOUNTER — Other Ambulatory Visit: Payer: BC Managed Care – PPO

## 2013-11-15 ENCOUNTER — Other Ambulatory Visit: Payer: Self-pay | Admitting: Oncology

## 2013-11-17 ENCOUNTER — Ambulatory Visit: Payer: BC Managed Care – PPO

## 2013-11-17 ENCOUNTER — Other Ambulatory Visit: Payer: BC Managed Care – PPO

## 2013-11-17 ENCOUNTER — Telehealth: Payer: Self-pay | Admitting: Oncology

## 2013-11-17 ENCOUNTER — Ambulatory Visit: Payer: BC Managed Care – PPO | Admitting: Oncology

## 2013-11-17 NOTE — Telephone Encounter (Signed)
LEFT MESSAGE FOR PATIENT TO INFORM HER NP APPT WOULD BE CANCEL PER MD AND TO RETURN CALL TO CONFIRM MESSAGE WAS RECEIVED.

## 2013-11-21 ENCOUNTER — Other Ambulatory Visit (INDEPENDENT_AMBULATORY_CARE_PROVIDER_SITE_OTHER): Payer: BC Managed Care – PPO | Admitting: *Deleted

## 2013-11-21 ENCOUNTER — Telehealth: Payer: Self-pay | Admitting: *Deleted

## 2013-11-21 DIAGNOSIS — E876 Hypokalemia: Secondary | ICD-10-CM

## 2013-11-21 LAB — CA 125

## 2013-11-21 MED ORDER — HYDROMORPHONE HCL 2 MG PO TABS
2.0000 mg | ORAL_TABLET | ORAL | Status: DC | PRN
Start: 2013-11-21 — End: 2014-12-30

## 2013-11-21 MED ORDER — ONDANSETRON HCL 8 MG PO TABS: 8.0000 mg | ORAL_TABLET | Freq: Three times a day (TID) | ORAL | Status: DC | PRN

## 2013-11-21 NOTE — Progress Notes (Signed)
Patient is here for lab to have potassium rechecked prior to her surgery scheduled for next week on Tuesday.  She would also like to know what she can do different for her pain because she was advised to discontinue ibuprofen prior to her surgery.  Dr. Hulan Fray has prescribed Dilaudid and Zofran for patient.  We will fax her labs to Dr. Conley Canal on Monday morning direct fax to [facility] [phone].  Phone # [phone].

## 2013-11-21 NOTE — Telephone Encounter (Signed)
Fax received from [place] at Dr. Audie Box office. Pt CA 125 Results 191. Will forward to provider for review. Copy of labs sent to [facility] to be scanned into epic

## 2013-11-22 LAB — POTASSIUM: Potassium: 5 mEq/L (ref 3.5–5.3)

## 2013-12-29 ENCOUNTER — Encounter: Payer: Self-pay | Admitting: Oncology

## 2013-12-29 NOTE — Progress Notes (Signed)
No dates for treatment.

## 2014-01-05 ENCOUNTER — Encounter (HOSPITAL_COMMUNITY): Payer: Self-pay | Admitting: Obstetrics & Gynecology

## 2014-01-07 ENCOUNTER — Other Ambulatory Visit (INDEPENDENT_AMBULATORY_CARE_PROVIDER_SITE_OTHER): Payer: BC Managed Care – PPO | Admitting: *Deleted

## 2014-01-07 DIAGNOSIS — C569 Malignant neoplasm of unspecified ovary: Secondary | ICD-10-CM | POA: Diagnosis not present

## 2014-01-07 DIAGNOSIS — G4700 Insomnia, unspecified: Secondary | ICD-10-CM | POA: Diagnosis not present

## 2014-01-07 LAB — CBC WITH DIFFERENTIAL/PLATELET
Basophils Absolute: 0.1 10*3/uL (ref 0.0–0.1)
Basophils Relative: 1 % (ref 0–1)
EOS ABS: 0.2 10*3/uL (ref 0.0–0.7)
EOS PCT: 3 % (ref 0–5)
HEMATOCRIT: 34.9 % — AB (ref 36.0–46.0)
Hemoglobin: 12.3 g/dL (ref 12.0–15.0)
LYMPHS PCT: 37 % (ref 12–46)
Lymphs Abs: 1.9 10*3/uL (ref 0.7–4.0)
MCH: 30.8 pg (ref 26.0–34.0)
MCHC: 35.2 g/dL (ref 30.0–36.0)
MCV: 87.3 fL (ref 78.0–100.0)
MONO ABS: 0.1 10*3/uL (ref 0.1–1.0)
MONOS PCT: 2 % — AB (ref 3–12)
Neutro Abs: 2.9 10*3/uL (ref 1.7–7.7)
Neutrophils Relative %: 57 % (ref 43–77)
PLATELETS: 213 10*3/uL (ref 150–400)
RBC: 4 MIL/uL (ref 3.87–5.11)
RDW: 13.5 % (ref 11.5–15.5)
WBC: 5 10*3/uL (ref 4.0–10.5)

## 2014-01-07 MED ORDER — LORAZEPAM 1 MG PO TABS: 1.0000 mg | ORAL_TABLET | Freq: Every day | ORAL | Status: DC

## 2014-01-14 ENCOUNTER — Other Ambulatory Visit: Payer: Self-pay | Admitting: Obstetrics & Gynecology

## 2014-01-14 ENCOUNTER — Other Ambulatory Visit (INDEPENDENT_AMBULATORY_CARE_PROVIDER_SITE_OTHER): Payer: BC Managed Care – PPO | Admitting: *Deleted

## 2014-01-14 DIAGNOSIS — C569 Malignant neoplasm of unspecified ovary: Secondary | ICD-10-CM | POA: Diagnosis not present

## 2014-01-14 DIAGNOSIS — R112 Nausea with vomiting, unspecified: Secondary | ICD-10-CM

## 2014-01-14 LAB — CBC WITH DIFFERENTIAL/PLATELET
BASOS ABS: 0 10*3/uL (ref 0.0–0.1)
Basophils Relative: 0 % (ref 0–1)
EOS PCT: 3 % (ref 0–5)
Eosinophils Absolute: 0.1 10*3/uL (ref 0.0–0.7)
HEMATOCRIT: 37.8 % (ref 36.0–46.0)
Hemoglobin: 12.6 g/dL (ref 12.0–15.0)
LYMPHS ABS: 2 10*3/uL (ref 0.7–4.0)
LYMPHS PCT: 42 % (ref 12–46)
MCH: 30.3 pg (ref 26.0–34.0)
MCHC: 33.3 g/dL (ref 30.0–36.0)
MCV: 90.9 fL (ref 78.0–100.0)
Monocytes Absolute: 0.4 10*3/uL (ref 0.1–1.0)
Monocytes Relative: 8 % (ref 3–12)
NEUTROS ABS: 2.2 10*3/uL (ref 1.7–7.7)
Neutrophils Relative %: 47 % (ref 43–77)
Platelets: 201 10*3/uL (ref 150–400)
RBC: 4.16 MIL/uL (ref 3.87–5.11)
RDW: 14.2 % (ref 11.5–15.5)
WBC: 4.7 10*3/uL (ref 4.0–10.5)

## 2014-01-14 LAB — COMPREHENSIVE METABOLIC PANEL
ALT: 77 U/L — AB (ref 0–35)
AST: 25 U/L (ref 0–37)
Albumin: 3.9 g/dL (ref 3.5–5.2)
Alkaline Phosphatase: 76 U/L (ref 39–117)
BUN: 12 mg/dL (ref 6–23)
CALCIUM: 8.9 mg/dL (ref 8.4–10.5)
CO2: 28 meq/L (ref 19–32)
CREATININE: 0.88 mg/dL (ref 0.50–1.10)
Chloride: 102 mEq/L (ref 96–112)
Glucose, Bld: 78 mg/dL (ref 70–99)
Potassium: 4.3 mEq/L (ref 3.5–5.3)
Sodium: 139 mEq/L (ref 135–145)
Total Bilirubin: 0.3 mg/dL (ref 0.2–1.2)
Total Protein: 6.1 g/dL (ref 6.0–8.3)

## 2014-01-14 NOTE — Telephone Encounter (Signed)
Patient is needing refill of zofran.

## 2014-01-27 ENCOUNTER — Other Ambulatory Visit (INDEPENDENT_AMBULATORY_CARE_PROVIDER_SITE_OTHER): Payer: BC Managed Care – PPO | Admitting: *Deleted

## 2014-01-27 DIAGNOSIS — Z01812 Encounter for preprocedural laboratory examination: Secondary | ICD-10-CM

## 2014-01-27 DIAGNOSIS — C569 Malignant neoplasm of unspecified ovary: Secondary | ICD-10-CM

## 2014-01-27 LAB — CBC WITH DIFFERENTIAL/PLATELET
Basophils Absolute: 0 10*3/uL (ref 0.0–0.1)
Basophils Relative: 0 % (ref 0–1)
Eosinophils Absolute: 0 10*3/uL (ref 0.0–0.7)
Eosinophils Relative: 0 % (ref 0–5)
HEMATOCRIT: 38.4 % (ref 36.0–46.0)
HEMOGLOBIN: 13 g/dL (ref 12.0–15.0)
LYMPHS ABS: 2.4 10*3/uL (ref 0.7–4.0)
Lymphocytes Relative: 43 % (ref 12–46)
MCH: 30.9 pg (ref 26.0–34.0)
MCHC: 33.9 g/dL (ref 30.0–36.0)
MCV: 91.2 fL (ref 78.0–100.0)
MONO ABS: 0.1 10*3/uL (ref 0.1–1.0)
MONOS PCT: 2 % — AB (ref 3–12)
MPV: 9.5 fL (ref 9.4–12.4)
NEUTROS PCT: 55 % (ref 43–77)
Neutro Abs: 3.1 10*3/uL (ref 1.7–7.7)
Platelets: 246 10*3/uL (ref 150–400)
RBC: 4.21 MIL/uL (ref 3.87–5.11)
RDW: 14.7 % (ref 11.5–15.5)
WBC: 5.6 10*3/uL (ref 4.0–10.5)

## 2014-01-27 NOTE — Progress Notes (Signed)
Patients pharmacy has requested mail order for her zofran and ativan.  Dr. Hulan Fray has authorized me to approve this for her.  Mail order Rx for both prescriptions have been sent to her mail order pharmacy.

## 2014-02-04 ENCOUNTER — Other Ambulatory Visit (INDEPENDENT_AMBULATORY_CARE_PROVIDER_SITE_OTHER): Payer: BC Managed Care – PPO | Admitting: *Deleted

## 2014-02-04 DIAGNOSIS — C563 Malignant neoplasm of bilateral ovaries: Secondary | ICD-10-CM

## 2014-02-04 DIAGNOSIS — C562 Malignant neoplasm of left ovary: Secondary | ICD-10-CM

## 2014-02-04 DIAGNOSIS — C561 Malignant neoplasm of right ovary: Secondary | ICD-10-CM

## 2014-02-04 LAB — COMPREHENSIVE METABOLIC PANEL
ALK PHOS: 89 U/L (ref 39–117)
ALT: 65 U/L — ABNORMAL HIGH (ref 0–35)
AST: 28 U/L (ref 0–37)
Albumin: 3.8 g/dL (ref 3.5–5.2)
BUN: 16 mg/dL (ref 6–23)
CHLORIDE: 104 meq/L (ref 96–112)
CO2: 28 meq/L (ref 19–32)
Calcium: 8.3 mg/dL — ABNORMAL LOW (ref 8.4–10.5)
Creat: 0.86 mg/dL (ref 0.50–1.10)
Glucose, Bld: 107 mg/dL — ABNORMAL HIGH (ref 70–99)
Potassium: 3.6 mEq/L (ref 3.5–5.3)
SODIUM: 139 meq/L (ref 135–145)
TOTAL PROTEIN: 6 g/dL (ref 6.0–8.3)
Total Bilirubin: 0.2 mg/dL (ref 0.2–1.2)

## 2014-02-04 NOTE — Progress Notes (Signed)
Patient here today for a CBC with diff and CMET.

## 2014-02-05 LAB — CBC WITH DIFFERENTIAL/PLATELET
Basophils Absolute: 0 10*3/uL (ref 0.0–0.1)
Basophils Relative: 0 % (ref 0–1)
Eosinophils Absolute: 0 10*3/uL (ref 0.0–0.7)
Eosinophils Relative: 0 % (ref 0–5)
HEMATOCRIT: 33.8 % — AB (ref 36.0–46.0)
HEMOGLOBIN: 11.9 g/dL — AB (ref 12.0–15.0)
LYMPHS ABS: 2.1 10*3/uL (ref 0.7–4.0)
LYMPHS PCT: 32 % (ref 12–46)
MCH: 30.7 pg (ref 26.0–34.0)
MCHC: 35.2 g/dL (ref 30.0–36.0)
MCV: 87.1 fL (ref 78.0–100.0)
MONO ABS: 0.3 10*3/uL (ref 0.1–1.0)
MONOS PCT: 5 % (ref 3–12)
MPV: 8.8 fL — ABNORMAL LOW (ref 9.4–12.4)
NEUTROS ABS: 4.2 10*3/uL (ref 1.7–7.7)
Neutrophils Relative %: 63 % (ref 43–77)
Platelets: 180 10*3/uL (ref 150–400)
RBC: 3.88 MIL/uL (ref 3.87–5.11)
RDW: 14.8 % (ref 11.5–15.5)
WBC: 6.6 10*3/uL (ref 4.0–10.5)

## 2014-02-10 ENCOUNTER — Other Ambulatory Visit (INDEPENDENT_AMBULATORY_CARE_PROVIDER_SITE_OTHER): Payer: BC Managed Care – PPO | Admitting: *Deleted

## 2014-02-10 DIAGNOSIS — N8320 Unspecified ovarian cysts: Secondary | ICD-10-CM

## 2014-02-10 DIAGNOSIS — N83201 Unspecified ovarian cyst, right side: Secondary | ICD-10-CM

## 2014-02-10 LAB — COMPREHENSIVE METABOLIC PANEL
ALT: 47 U/L — AB (ref 0–35)
AST: 25 U/L (ref 0–37)
Albumin: 3.6 g/dL (ref 3.5–5.2)
Alkaline Phosphatase: 76 U/L (ref 39–117)
BILIRUBIN TOTAL: 0.2 mg/dL (ref 0.2–1.2)
BUN: 15 mg/dL (ref 6–23)
CHLORIDE: 104 meq/L (ref 96–112)
CO2: 26 meq/L (ref 19–32)
Calcium: 8.8 mg/dL (ref 8.4–10.5)
Creat: 0.98 mg/dL (ref 0.50–1.10)
GLUCOSE: 120 mg/dL — AB (ref 70–99)
Potassium: 4.6 mEq/L (ref 3.5–5.3)
Sodium: 137 mEq/L (ref 135–145)
TOTAL PROTEIN: 5.8 g/dL — AB (ref 6.0–8.3)

## 2014-02-10 NOTE — Progress Notes (Signed)
Pt here today for a CBC and CMET.

## 2014-02-11 LAB — CBC WITH DIFFERENTIAL/PLATELET
BASOS ABS: 0 10*3/uL (ref 0.0–0.1)
Basophils Relative: 0 % (ref 0–1)
EOS ABS: 0.1 10*3/uL (ref 0.0–0.7)
Eosinophils Relative: 2 % (ref 0–5)
HCT: 34.7 % — ABNORMAL LOW (ref 36.0–46.0)
Hemoglobin: 11.9 g/dL — ABNORMAL LOW (ref 12.0–15.0)
LYMPHS PCT: 36 % (ref 12–46)
Lymphs Abs: 2.2 10*3/uL (ref 0.7–4.0)
MCH: 30.7 pg (ref 26.0–34.0)
MCHC: 34.3 g/dL (ref 30.0–36.0)
MCV: 89.4 fL (ref 78.0–100.0)
MPV: 8.9 fL — ABNORMAL LOW (ref 9.4–12.4)
Monocytes Absolute: 0.5 10*3/uL (ref 0.1–1.0)
Monocytes Relative: 8 % (ref 3–12)
Neutro Abs: 3.2 10*3/uL (ref 1.7–7.7)
Neutrophils Relative %: 54 % (ref 43–77)
Platelets: 277 10*3/uL (ref 150–400)
RBC: 3.88 MIL/uL (ref 3.87–5.11)
RDW: 15.4 % (ref 11.5–15.5)
WBC: 6 10*3/uL (ref 4.0–10.5)

## 2014-02-17 ENCOUNTER — Other Ambulatory Visit (INDEPENDENT_AMBULATORY_CARE_PROVIDER_SITE_OTHER): Payer: BC Managed Care – PPO | Admitting: *Deleted

## 2014-02-17 DIAGNOSIS — C561 Malignant neoplasm of right ovary: Secondary | ICD-10-CM | POA: Diagnosis not present

## 2014-02-17 NOTE — Progress Notes (Signed)
Patient here for labs only today.  

## 2014-02-18 LAB — CBC WITH DIFFERENTIAL/PLATELET
BASOS PCT: 1 % (ref 0–1)
Basophils Absolute: 0.1 10*3/uL (ref 0.0–0.1)
EOS PCT: 2 % (ref 0–5)
Eosinophils Absolute: 0.1 10*3/uL (ref 0.0–0.7)
HCT: 36.6 % (ref 36.0–46.0)
Hemoglobin: 12.7 g/dL (ref 12.0–15.0)
Lymphocytes Relative: 48 % — ABNORMAL HIGH (ref 12–46)
Lymphs Abs: 2.9 10*3/uL (ref 0.7–4.0)
MCH: 30.9 pg (ref 26.0–34.0)
MCHC: 34.7 g/dL (ref 30.0–36.0)
MCV: 89.1 fL (ref 78.0–100.0)
MPV: 9.5 fL (ref 9.4–12.4)
Monocytes Absolute: 0.2 10*3/uL (ref 0.1–1.0)
Monocytes Relative: 3 % (ref 3–12)
NEUTROS ABS: 2.8 10*3/uL (ref 1.7–7.7)
Neutrophils Relative %: 46 % (ref 43–77)
PLATELETS: 275 10*3/uL (ref 150–400)
RBC: 4.11 MIL/uL (ref 3.87–5.11)
RDW: 15.8 % — AB (ref 11.5–15.5)
WBC: 6.1 10*3/uL (ref 4.0–10.5)

## 2014-02-18 LAB — COMPREHENSIVE METABOLIC PANEL
ALK PHOS: 96 U/L (ref 39–117)
ALT: 128 U/L — ABNORMAL HIGH (ref 0–35)
AST: 54 U/L — AB (ref 0–37)
Albumin: 4 g/dL (ref 3.5–5.2)
BUN: 25 mg/dL — AB (ref 6–23)
CO2: 26 mEq/L (ref 19–32)
CREATININE: 1.38 mg/dL — AB (ref 0.50–1.10)
Calcium: 9.2 mg/dL (ref 8.4–10.5)
Chloride: 98 mEq/L (ref 96–112)
Glucose, Bld: 122 mg/dL — ABNORMAL HIGH (ref 70–99)
Potassium: 3.4 mEq/L — ABNORMAL LOW (ref 3.5–5.3)
Sodium: 137 mEq/L (ref 135–145)
Total Bilirubin: 0.4 mg/dL (ref 0.2–1.2)
Total Protein: 6.4 g/dL (ref 6.0–8.3)

## 2014-02-25 ENCOUNTER — Other Ambulatory Visit (INDEPENDENT_AMBULATORY_CARE_PROVIDER_SITE_OTHER): Payer: BC Managed Care – PPO | Admitting: *Deleted

## 2014-02-25 DIAGNOSIS — C569 Malignant neoplasm of unspecified ovary: Secondary | ICD-10-CM | POA: Diagnosis not present

## 2014-02-26 LAB — CBC WITH DIFFERENTIAL/PLATELET
Basophils Absolute: 0 10*3/uL (ref 0.0–0.1)
Basophils Relative: 0 % (ref 0–1)
Eosinophils Absolute: 0.1 10*3/uL (ref 0.0–0.7)
Eosinophils Relative: 1 % (ref 0–5)
HCT: 31.6 % — ABNORMAL LOW (ref 36.0–46.0)
HEMOGLOBIN: 11.3 g/dL — AB (ref 12.0–15.0)
Lymphocytes Relative: 35 % (ref 12–46)
Lymphs Abs: 2.1 10*3/uL (ref 0.7–4.0)
MCH: 31.8 pg (ref 26.0–34.0)
MCHC: 35.8 g/dL (ref 30.0–36.0)
MCV: 89 fL (ref 78.0–100.0)
MPV: 9 fL — ABNORMAL LOW (ref 9.4–12.4)
Monocytes Absolute: 0.3 10*3/uL (ref 0.1–1.0)
Monocytes Relative: 5 % (ref 3–12)
NEUTROS ABS: 3.6 10*3/uL (ref 1.7–7.7)
NEUTROS PCT: 59 % (ref 43–77)
Platelets: 168 10*3/uL (ref 150–400)
RBC: 3.55 MIL/uL — AB (ref 3.87–5.11)
RDW: 15.9 % — ABNORMAL HIGH (ref 11.5–15.5)
WBC: 6.1 10*3/uL (ref 4.0–10.5)

## 2014-03-03 ENCOUNTER — Other Ambulatory Visit (INDEPENDENT_AMBULATORY_CARE_PROVIDER_SITE_OTHER): Payer: BC Managed Care – PPO | Admitting: *Deleted

## 2014-03-03 DIAGNOSIS — C561 Malignant neoplasm of right ovary: Secondary | ICD-10-CM

## 2014-03-03 LAB — COMPREHENSIVE METABOLIC PANEL
ALBUMIN: 3.6 g/dL (ref 3.5–5.2)
ALT: 34 U/L (ref 0–35)
AST: 19 U/L (ref 0–37)
Alkaline Phosphatase: 87 U/L (ref 39–117)
BUN: 11 mg/dL (ref 6–23)
CALCIUM: 9.1 mg/dL (ref 8.4–10.5)
CHLORIDE: 104 meq/L (ref 96–112)
CO2: 27 meq/L (ref 19–32)
CREATININE: 0.92 mg/dL (ref 0.50–1.10)
GLUCOSE: 104 mg/dL — AB (ref 70–99)
POTASSIUM: 4.4 meq/L (ref 3.5–5.3)
Sodium: 138 mEq/L (ref 135–145)
Total Bilirubin: 0.2 mg/dL (ref 0.2–1.2)
Total Protein: 5.7 g/dL — ABNORMAL LOW (ref 6.0–8.3)

## 2014-03-03 NOTE — Progress Notes (Signed)
Patient here today for labs

## 2014-03-04 LAB — CBC WITH DIFFERENTIAL/PLATELET
Basophils Absolute: 0 10*3/uL (ref 0.0–0.1)
Basophils Relative: 0 % (ref 0–1)
EOS ABS: 0.1 10*3/uL (ref 0.0–0.7)
Eosinophils Relative: 2 % (ref 0–5)
HCT: 34.3 % — ABNORMAL LOW (ref 36.0–46.0)
Hemoglobin: 11.7 g/dL — ABNORMAL LOW (ref 12.0–15.0)
LYMPHS ABS: 1.8 10*3/uL (ref 0.7–4.0)
LYMPHS PCT: 39 % (ref 12–46)
MCH: 31.3 pg (ref 26.0–34.0)
MCHC: 34.1 g/dL (ref 30.0–36.0)
MCV: 91.7 fL (ref 78.0–100.0)
MONOS PCT: 14 % — AB (ref 3–12)
MPV: 9.2 fL (ref 8.6–12.4)
Monocytes Absolute: 0.7 10*3/uL (ref 0.1–1.0)
Neutro Abs: 2.1 10*3/uL (ref 1.7–7.7)
Neutrophils Relative %: 45 % (ref 43–77)
PLATELETS: 272 10*3/uL (ref 150–400)
RBC: 3.74 MIL/uL — ABNORMAL LOW (ref 3.87–5.11)
RDW: 16.6 % — ABNORMAL HIGH (ref 11.5–15.5)
WBC: 4.7 10*3/uL (ref 4.0–10.5)

## 2014-03-10 ENCOUNTER — Other Ambulatory Visit (INDEPENDENT_AMBULATORY_CARE_PROVIDER_SITE_OTHER): Payer: Self-pay | Admitting: *Deleted

## 2014-03-10 DIAGNOSIS — C569 Malignant neoplasm of unspecified ovary: Secondary | ICD-10-CM

## 2014-03-11 LAB — COMPREHENSIVE METABOLIC PANEL
ALK PHOS: 95 U/L (ref 39–117)
ALT: 39 U/L — AB (ref 0–35)
AST: 25 U/L (ref 0–37)
Albumin: 3.7 g/dL (ref 3.5–5.2)
BILIRUBIN TOTAL: 0.3 mg/dL (ref 0.2–1.2)
BUN: 14 mg/dL (ref 6–23)
CO2: 23 meq/L (ref 19–32)
Calcium: 8.5 mg/dL (ref 8.4–10.5)
Chloride: 103 mEq/L (ref 96–112)
Creat: 0.9 mg/dL (ref 0.50–1.10)
GLUCOSE: 121 mg/dL — AB (ref 70–99)
POTASSIUM: 4 meq/L (ref 3.5–5.3)
Sodium: 138 mEq/L (ref 135–145)
TOTAL PROTEIN: 6 g/dL (ref 6.0–8.3)

## 2014-03-11 LAB — CBC WITH DIFFERENTIAL/PLATELET
BASOS PCT: 1 % (ref 0–1)
Basophils Absolute: 0.1 10*3/uL (ref 0.0–0.1)
Eosinophils Absolute: 0.1 10*3/uL (ref 0.0–0.7)
Eosinophils Relative: 1 % (ref 0–5)
HCT: 36.3 % (ref 36.0–46.0)
Hemoglobin: 12.5 g/dL (ref 12.0–15.0)
LYMPHS PCT: 35 % (ref 12–46)
Lymphs Abs: 2.1 10*3/uL (ref 0.7–4.0)
MCH: 31.5 pg (ref 26.0–34.0)
MCHC: 34.4 g/dL (ref 30.0–36.0)
MCV: 91.4 fL (ref 78.0–100.0)
MONOS PCT: 12 % (ref 3–12)
MPV: 9.4 fL (ref 8.6–12.4)
Monocytes Absolute: 0.7 10*3/uL (ref 0.1–1.0)
NEUTROS PCT: 51 % (ref 43–77)
Neutro Abs: 3.1 10*3/uL (ref 1.7–7.7)
Platelets: 242 10*3/uL (ref 150–400)
RBC: 3.97 MIL/uL (ref 3.87–5.11)
RDW: 17 % — AB (ref 11.5–15.5)
WBC: 6.1 10*3/uL (ref 4.0–10.5)

## 2014-03-11 LAB — CA 125: CA 125: 9 U/mL (ref <35)

## 2014-03-17 ENCOUNTER — Other Ambulatory Visit (INDEPENDENT_AMBULATORY_CARE_PROVIDER_SITE_OTHER): Payer: Self-pay | Admitting: *Deleted

## 2014-03-17 DIAGNOSIS — C569 Malignant neoplasm of unspecified ovary: Secondary | ICD-10-CM

## 2014-03-18 LAB — COMPREHENSIVE METABOLIC PANEL
ALT: 82 U/L — AB (ref 0–35)
AST: 34 U/L (ref 0–37)
Albumin: 3.9 g/dL (ref 3.5–5.2)
Alkaline Phosphatase: 91 U/L (ref 39–117)
BILIRUBIN TOTAL: 0.4 mg/dL (ref 0.2–1.2)
BUN: 29 mg/dL — ABNORMAL HIGH (ref 6–23)
CALCIUM: 8.8 mg/dL (ref 8.4–10.5)
CO2: 26 meq/L (ref 19–32)
Chloride: 100 mEq/L (ref 96–112)
Creat: 1.12 mg/dL — ABNORMAL HIGH (ref 0.50–1.10)
GLUCOSE: 95 mg/dL (ref 70–99)
Potassium: 3.8 mEq/L (ref 3.5–5.3)
SODIUM: 135 meq/L (ref 135–145)
TOTAL PROTEIN: 6.2 g/dL (ref 6.0–8.3)

## 2014-03-18 LAB — CBC WITH DIFFERENTIAL/PLATELET
Basophils Absolute: 0 10*3/uL (ref 0.0–0.1)
Basophils Relative: 0 % (ref 0–1)
Eosinophils Absolute: 0.1 10*3/uL (ref 0.0–0.7)
Eosinophils Relative: 1 % (ref 0–5)
HEMATOCRIT: 36.2 % (ref 36.0–46.0)
Hemoglobin: 12.6 g/dL (ref 12.0–15.0)
Lymphocytes Relative: 37 % (ref 12–46)
Lymphs Abs: 2.8 10*3/uL (ref 0.7–4.0)
MCH: 31.6 pg (ref 26.0–34.0)
MCHC: 34.8 g/dL (ref 30.0–36.0)
MCV: 90.7 fL (ref 78.0–100.0)
MONOS PCT: 1 % — AB (ref 3–12)
MPV: 9.9 fL (ref 8.6–12.4)
Monocytes Absolute: 0.1 10*3/uL (ref 0.1–1.0)
NEUTROS PCT: 61 % (ref 43–77)
Neutro Abs: 4.7 10*3/uL (ref 1.7–7.7)
Platelets: 221 10*3/uL (ref 150–400)
RBC: 3.99 MIL/uL (ref 3.87–5.11)
RDW: 17.2 % — ABNORMAL HIGH (ref 11.5–15.5)
WBC: 7.7 10*3/uL (ref 4.0–10.5)

## 2014-03-25 ENCOUNTER — Other Ambulatory Visit (INDEPENDENT_AMBULATORY_CARE_PROVIDER_SITE_OTHER): Payer: Self-pay | Admitting: *Deleted

## 2014-03-25 DIAGNOSIS — C561 Malignant neoplasm of right ovary: Secondary | ICD-10-CM

## 2014-03-25 NOTE — Progress Notes (Signed)
Patient came in today for a CBC and CMP.

## 2014-03-26 LAB — COMPREHENSIVE METABOLIC PANEL
ALK PHOS: 75 U/L (ref 39–117)
ALT: 34 U/L (ref 0–35)
AST: 18 U/L (ref 0–37)
Albumin: 3.9 g/dL (ref 3.5–5.2)
BILIRUBIN TOTAL: 0.3 mg/dL (ref 0.2–1.2)
BUN: 20 mg/dL (ref 6–23)
CO2: 26 mEq/L (ref 19–32)
Calcium: 9 mg/dL (ref 8.4–10.5)
Chloride: 102 mEq/L (ref 96–112)
Creat: 1.02 mg/dL (ref 0.50–1.10)
Glucose, Bld: 118 mg/dL — ABNORMAL HIGH (ref 70–99)
Potassium: 3.5 mEq/L (ref 3.5–5.3)
SODIUM: 136 meq/L (ref 135–145)
Total Protein: 6.2 g/dL (ref 6.0–8.3)

## 2014-03-26 LAB — CBC WITH DIFFERENTIAL/PLATELET
Basophils Absolute: 0 10*3/uL (ref 0.0–0.1)
Basophils Relative: 0 % (ref 0–1)
Eosinophils Absolute: 0.1 10*3/uL (ref 0.0–0.7)
Eosinophils Relative: 1 % (ref 0–5)
HEMATOCRIT: 33.5 % — AB (ref 36.0–46.0)
Hemoglobin: 11.8 g/dL — ABNORMAL LOW (ref 12.0–15.0)
LYMPHS ABS: 4.1 10*3/uL — AB (ref 0.7–4.0)
LYMPHS PCT: 38 % (ref 12–46)
MCH: 32.1 pg (ref 26.0–34.0)
MCHC: 35.2 g/dL (ref 30.0–36.0)
MCV: 91 fL (ref 78.0–100.0)
MONOS PCT: 6 % (ref 3–12)
MPV: 9.2 fL (ref 8.6–12.4)
Monocytes Absolute: 0.6 10*3/uL (ref 0.1–1.0)
Neutro Abs: 5.9 10*3/uL (ref 1.7–7.7)
Neutrophils Relative %: 55 % (ref 43–77)
PLATELETS: 161 10*3/uL (ref 150–400)
RBC: 3.68 MIL/uL — ABNORMAL LOW (ref 3.87–5.11)
RDW: 17.7 % — AB (ref 11.5–15.5)
WBC: 10.8 10*3/uL — AB (ref 4.0–10.5)

## 2014-03-31 ENCOUNTER — Other Ambulatory Visit (INDEPENDENT_AMBULATORY_CARE_PROVIDER_SITE_OTHER): Payer: Self-pay | Admitting: *Deleted

## 2014-03-31 DIAGNOSIS — C569 Malignant neoplasm of unspecified ovary: Secondary | ICD-10-CM

## 2014-04-01 LAB — COMPREHENSIVE METABOLIC PANEL
ALBUMIN: 3.6 g/dL (ref 3.5–5.2)
ALT: 29 U/L (ref 0–35)
AST: 20 U/L (ref 0–37)
Alkaline Phosphatase: 75 U/L (ref 39–117)
BILIRUBIN TOTAL: 0.2 mg/dL (ref 0.2–1.2)
BUN: 14 mg/dL (ref 6–23)
CALCIUM: 8.8 mg/dL (ref 8.4–10.5)
CO2: 26 meq/L (ref 19–32)
Chloride: 102 mEq/L (ref 96–112)
Creat: 0.92 mg/dL (ref 0.50–1.10)
Glucose, Bld: 108 mg/dL — ABNORMAL HIGH (ref 70–99)
Potassium: 4.6 mEq/L (ref 3.5–5.3)
Sodium: 136 mEq/L (ref 135–145)
Total Protein: 5.7 g/dL — ABNORMAL LOW (ref 6.0–8.3)

## 2014-04-01 LAB — CBC WITH DIFFERENTIAL/PLATELET
BASOS PCT: 0 % (ref 0–1)
Basophils Absolute: 0 10*3/uL (ref 0.0–0.1)
EOS ABS: 0.1 10*3/uL (ref 0.0–0.7)
Eosinophils Relative: 1 % (ref 0–5)
HEMATOCRIT: 32.8 % — AB (ref 36.0–46.0)
Hemoglobin: 11.3 g/dL — ABNORMAL LOW (ref 12.0–15.0)
LYMPHS ABS: 1.7 10*3/uL (ref 0.7–4.0)
LYMPHS PCT: 30 % (ref 12–46)
MCH: 32.5 pg (ref 26.0–34.0)
MCHC: 34.5 g/dL (ref 30.0–36.0)
MCV: 94.3 fL (ref 78.0–100.0)
MONO ABS: 0.6 10*3/uL (ref 0.1–1.0)
MONOS PCT: 11 % (ref 3–12)
MPV: 8.6 fL (ref 8.6–12.4)
NEUTROS ABS: 3.2 10*3/uL (ref 1.7–7.7)
Neutrophils Relative %: 58 % (ref 43–77)
Platelets: 221 10*3/uL (ref 150–400)
RBC: 3.48 MIL/uL — ABNORMAL LOW (ref 3.87–5.11)
RDW: 17.9 % — AB (ref 11.5–15.5)
WBC: 5.5 10*3/uL (ref 4.0–10.5)

## 2014-04-01 LAB — MAGNESIUM: MAGNESIUM: 2 mg/dL (ref 1.5–2.5)

## 2014-04-07 ENCOUNTER — Other Ambulatory Visit (INDEPENDENT_AMBULATORY_CARE_PROVIDER_SITE_OTHER): Payer: Self-pay | Admitting: *Deleted

## 2014-04-07 DIAGNOSIS — C569 Malignant neoplasm of unspecified ovary: Secondary | ICD-10-CM

## 2014-04-07 LAB — CBC WITH DIFFERENTIAL/PLATELET
Basophils Absolute: 0 10*3/uL (ref 0.0–0.1)
Basophils Relative: 0 % (ref 0–1)
Eosinophils Absolute: 0.1 10*3/uL (ref 0.0–0.7)
Eosinophils Relative: 1 % (ref 0–5)
HCT: 33.6 % — ABNORMAL LOW (ref 36.0–46.0)
Hemoglobin: 11.6 g/dL — ABNORMAL LOW (ref 12.0–15.0)
Lymphocytes Relative: 41 % (ref 12–46)
Lymphs Abs: 2.5 10*3/uL (ref 0.7–4.0)
MCH: 32.3 pg (ref 26.0–34.0)
MCHC: 34.5 g/dL (ref 30.0–36.0)
MCV: 93.6 fL (ref 78.0–100.0)
MONO ABS: 0.1 10*3/uL (ref 0.1–1.0)
MPV: 9.4 fL (ref 8.6–12.4)
Monocytes Relative: 2 % — ABNORMAL LOW (ref 3–12)
Neutro Abs: 3.5 10*3/uL (ref 1.7–7.7)
Neutrophils Relative %: 56 % (ref 43–77)
Platelets: 223 10*3/uL (ref 150–400)
RBC: 3.59 MIL/uL — AB (ref 3.87–5.11)
RDW: 17.5 % — ABNORMAL HIGH (ref 11.5–15.5)
WBC: 6.2 10*3/uL (ref 4.0–10.5)

## 2014-04-07 LAB — COMPREHENSIVE METABOLIC PANEL
ALBUMIN: 3.7 g/dL (ref 3.5–5.2)
ALK PHOS: 71 U/L (ref 39–117)
ALT: 58 U/L — AB (ref 0–35)
AST: 27 U/L (ref 0–37)
BUN: 25 mg/dL — ABNORMAL HIGH (ref 6–23)
CALCIUM: 8.8 mg/dL (ref 8.4–10.5)
CHLORIDE: 97 meq/L (ref 96–112)
CO2: 26 mEq/L (ref 19–32)
Creat: 1.3 mg/dL — ABNORMAL HIGH (ref 0.50–1.10)
Glucose, Bld: 102 mg/dL — ABNORMAL HIGH (ref 70–99)
Potassium: 3.8 mEq/L (ref 3.5–5.3)
Sodium: 134 mEq/L — ABNORMAL LOW (ref 135–145)
Total Bilirubin: 0.5 mg/dL (ref 0.2–1.2)
Total Protein: 6.2 g/dL (ref 6.0–8.3)

## 2014-04-07 LAB — MAGNESIUM: Magnesium: 1.5 mg/dL (ref 1.5–2.5)

## 2014-04-15 ENCOUNTER — Other Ambulatory Visit (INDEPENDENT_AMBULATORY_CARE_PROVIDER_SITE_OTHER): Payer: Self-pay | Admitting: *Deleted

## 2014-04-15 DIAGNOSIS — C569 Malignant neoplasm of unspecified ovary: Secondary | ICD-10-CM

## 2014-04-16 LAB — CBC WITH DIFFERENTIAL/PLATELET
Basophils Absolute: 0 10*3/uL (ref 0.0–0.1)
Basophils Relative: 0 % (ref 0–1)
Eosinophils Absolute: 0 10*3/uL (ref 0.0–0.7)
Eosinophils Relative: 0 % (ref 0–5)
HCT: 31.1 % — ABNORMAL LOW (ref 36.0–46.0)
Hemoglobin: 10.5 g/dL — ABNORMAL LOW (ref 12.0–15.0)
Lymphocytes Relative: 49 % — ABNORMAL HIGH (ref 12–46)
Lymphs Abs: 3.2 10*3/uL (ref 0.7–4.0)
MCH: 32.2 pg (ref 26.0–34.0)
MCHC: 33.8 g/dL (ref 30.0–36.0)
MCV: 95.4 fL (ref 78.0–100.0)
MPV: 9.7 fL (ref 8.6–12.4)
Monocytes Absolute: 0.4 10*3/uL (ref 0.1–1.0)
Monocytes Relative: 6 % (ref 3–12)
Neutro Abs: 2.9 10*3/uL (ref 1.7–7.7)
Neutrophils Relative %: 45 % (ref 43–77)
Platelets: 103 10*3/uL — ABNORMAL LOW (ref 150–400)
RBC: 3.26 MIL/uL — ABNORMAL LOW (ref 3.87–5.11)
RDW: 18.1 % — ABNORMAL HIGH (ref 11.5–15.5)
WBC: 6.5 10*3/uL (ref 4.0–10.5)

## 2014-04-16 LAB — COMPREHENSIVE METABOLIC PANEL
ALT: 34 U/L (ref 0–35)
AST: 19 U/L (ref 0–37)
Albumin: 3.7 g/dL (ref 3.5–5.2)
Alkaline Phosphatase: 69 U/L (ref 39–117)
BUN: 17 mg/dL (ref 6–23)
CALCIUM: 8.6 mg/dL (ref 8.4–10.5)
CHLORIDE: 105 meq/L (ref 96–112)
CO2: 23 mEq/L (ref 19–32)
Creat: 1.14 mg/dL — ABNORMAL HIGH (ref 0.50–1.10)
Glucose, Bld: 104 mg/dL — ABNORMAL HIGH (ref 70–99)
POTASSIUM: 3.5 meq/L (ref 3.5–5.3)
Sodium: 140 mEq/L (ref 135–145)
TOTAL PROTEIN: 5.6 g/dL — AB (ref 6.0–8.3)
Total Bilirubin: 0.3 mg/dL (ref 0.2–1.2)

## 2014-04-16 LAB — MAGNESIUM: Magnesium: 1.3 mg/dL — ABNORMAL LOW (ref 1.5–2.5)

## 2014-04-21 ENCOUNTER — Other Ambulatory Visit (INDEPENDENT_AMBULATORY_CARE_PROVIDER_SITE_OTHER): Payer: Self-pay | Admitting: *Deleted

## 2014-04-21 DIAGNOSIS — C569 Malignant neoplasm of unspecified ovary: Secondary | ICD-10-CM

## 2014-04-22 LAB — CBC WITH DIFFERENTIAL/PLATELET
BASOS ABS: 0 10*3/uL (ref 0.0–0.1)
BASOS PCT: 0 % (ref 0–1)
Eosinophils Absolute: 0 10*3/uL (ref 0.0–0.7)
Eosinophils Relative: 0 % (ref 0–5)
HEMATOCRIT: 30.4 % — AB (ref 36.0–46.0)
Hemoglobin: 10.2 g/dL — ABNORMAL LOW (ref 12.0–15.0)
Lymphocytes Relative: 43 % (ref 12–46)
Lymphs Abs: 1.6 10*3/uL (ref 0.7–4.0)
MCH: 32.6 pg (ref 26.0–34.0)
MCHC: 33.6 g/dL (ref 30.0–36.0)
MCV: 97.1 fL (ref 78.0–100.0)
MONOS PCT: 10 % (ref 3–12)
MPV: 9.2 fL (ref 8.6–12.4)
Monocytes Absolute: 0.4 10*3/uL (ref 0.1–1.0)
NEUTROS ABS: 1.7 10*3/uL (ref 1.7–7.7)
Neutrophils Relative %: 47 % (ref 43–77)
Platelets: 175 10*3/uL (ref 150–400)
RBC: 3.13 MIL/uL — AB (ref 3.87–5.11)
RDW: 18.1 % — ABNORMAL HIGH (ref 11.5–15.5)
WBC: 3.7 10*3/uL — ABNORMAL LOW (ref 4.0–10.5)

## 2014-04-22 LAB — COMPREHENSIVE METABOLIC PANEL
ALBUMIN: 3.7 g/dL (ref 3.5–5.2)
ALK PHOS: 72 U/L (ref 39–117)
ALT: 25 U/L (ref 0–35)
AST: 17 U/L (ref 0–37)
BUN: 15 mg/dL (ref 6–23)
CHLORIDE: 103 meq/L (ref 96–112)
CO2: 23 mEq/L (ref 19–32)
Calcium: 8.2 mg/dL — ABNORMAL LOW (ref 8.4–10.5)
Creat: 1.07 mg/dL (ref 0.50–1.10)
Glucose, Bld: 118 mg/dL — ABNORMAL HIGH (ref 70–99)
Potassium: 3.6 mEq/L (ref 3.5–5.3)
SODIUM: 136 meq/L (ref 135–145)
Total Bilirubin: 0.2 mg/dL (ref 0.2–1.2)
Total Protein: 5.5 g/dL — ABNORMAL LOW (ref 6.0–8.3)

## 2014-04-22 LAB — MAGNESIUM: Magnesium: 1.7 mg/dL (ref 1.5–2.5)

## 2014-04-28 ENCOUNTER — Other Ambulatory Visit (INDEPENDENT_AMBULATORY_CARE_PROVIDER_SITE_OTHER): Payer: Self-pay | Admitting: *Deleted

## 2014-04-28 DIAGNOSIS — C561 Malignant neoplasm of right ovary: Secondary | ICD-10-CM

## 2014-04-28 NOTE — Progress Notes (Signed)
Pt here today for labs only

## 2014-04-29 LAB — COMPREHENSIVE METABOLIC PANEL
ALBUMIN: 3.9 g/dL (ref 3.5–5.2)
ALK PHOS: 80 U/L (ref 39–117)
ALT: 41 U/L — AB (ref 0–35)
AST: 20 U/L (ref 0–37)
BILIRUBIN TOTAL: 0.3 mg/dL (ref 0.2–1.2)
BUN: 20 mg/dL (ref 6–23)
CHLORIDE: 101 meq/L (ref 96–112)
CO2: 26 mEq/L (ref 19–32)
Calcium: 8.9 mg/dL (ref 8.4–10.5)
Creat: 1.08 mg/dL (ref 0.50–1.10)
GLUCOSE: 117 mg/dL — AB (ref 70–99)
POTASSIUM: 3.7 meq/L (ref 3.5–5.3)
Sodium: 135 mEq/L (ref 135–145)
TOTAL PROTEIN: 6 g/dL (ref 6.0–8.3)

## 2014-04-29 LAB — CBC WITH DIFFERENTIAL/PLATELET
BASOS PCT: 0 % (ref 0–1)
Basophils Absolute: 0 10*3/uL (ref 0.0–0.1)
EOS ABS: 0.1 10*3/uL (ref 0.0–0.7)
EOS PCT: 1 % (ref 0–5)
HCT: 32.7 % — ABNORMAL LOW (ref 36.0–46.0)
Hemoglobin: 11.1 g/dL — ABNORMAL LOW (ref 12.0–15.0)
Lymphocytes Relative: 46 % (ref 12–46)
Lymphs Abs: 2.9 10*3/uL (ref 0.7–4.0)
MCH: 33.5 pg (ref 26.0–34.0)
MCHC: 33.9 g/dL (ref 30.0–36.0)
MCV: 98.8 fL (ref 78.0–100.0)
MPV: 9.1 fL (ref 8.6–12.4)
Monocytes Absolute: 0.2 10*3/uL (ref 0.1–1.0)
Monocytes Relative: 3 % (ref 3–12)
NEUTROS PCT: 50 % (ref 43–77)
Neutro Abs: 3.1 10*3/uL (ref 1.7–7.7)
Platelets: 206 10*3/uL (ref 150–400)
RBC: 3.31 MIL/uL — AB (ref 3.87–5.11)
RDW: 17.2 % — ABNORMAL HIGH (ref 11.5–15.5)
WBC: 6.2 10*3/uL (ref 4.0–10.5)

## 2014-04-29 LAB — MAGNESIUM: MAGNESIUM: 1.9 mg/dL (ref 1.5–2.5)

## 2014-05-07 ENCOUNTER — Other Ambulatory Visit (INDEPENDENT_AMBULATORY_CARE_PROVIDER_SITE_OTHER): Payer: BLUE CROSS/BLUE SHIELD | Admitting: *Deleted

## 2014-05-07 DIAGNOSIS — C561 Malignant neoplasm of right ovary: Secondary | ICD-10-CM

## 2014-05-07 LAB — COMPREHENSIVE METABOLIC PANEL
ALBUMIN: 4.2 g/dL (ref 3.5–5.2)
ALK PHOS: 81 U/L (ref 39–117)
ALT: 38 U/L — AB (ref 0–35)
AST: 21 U/L (ref 0–37)
BUN: 21 mg/dL (ref 6–23)
CALCIUM: 9.1 mg/dL (ref 8.4–10.5)
CO2: 25 mEq/L (ref 19–32)
CREATININE: 1.14 mg/dL — AB (ref 0.50–1.10)
Chloride: 103 mEq/L (ref 96–112)
Glucose, Bld: 110 mg/dL — ABNORMAL HIGH (ref 70–99)
POTASSIUM: 4.1 meq/L (ref 3.5–5.3)
Sodium: 137 mEq/L (ref 135–145)
Total Bilirubin: 0.3 mg/dL (ref 0.2–1.2)
Total Protein: 6.1 g/dL (ref 6.0–8.3)

## 2014-05-07 LAB — MAGNESIUM: MAGNESIUM: 2.1 mg/dL (ref 1.5–2.5)

## 2014-05-07 NOTE — Progress Notes (Signed)
Patient came in today for labs only. 

## 2014-05-08 LAB — CBC WITH DIFFERENTIAL/PLATELET
Basophils Absolute: 0.1 10*3/uL (ref 0.0–0.1)
Basophils Relative: 1 % (ref 0–1)
EOS PCT: 1 % (ref 0–5)
Eosinophils Absolute: 0.1 10*3/uL (ref 0.0–0.7)
HCT: 34 % — ABNORMAL LOW (ref 36.0–46.0)
HEMOGLOBIN: 11.5 g/dL — AB (ref 12.0–15.0)
LYMPHS PCT: 27 % (ref 12–46)
Lymphs Abs: 2.9 10*3/uL (ref 0.7–4.0)
MCH: 33.7 pg (ref 26.0–34.0)
MCHC: 33.8 g/dL (ref 30.0–36.0)
MCV: 99.7 fL (ref 78.0–100.0)
MONO ABS: 0.7 10*3/uL (ref 0.1–1.0)
MPV: 9.5 fL (ref 8.6–12.4)
Monocytes Relative: 7 % (ref 3–12)
Neutro Abs: 6.8 10*3/uL (ref 1.7–7.7)
Neutrophils Relative %: 64 % (ref 43–77)
Platelets: 254 10*3/uL (ref 150–400)
RBC: 3.41 MIL/uL — AB (ref 3.87–5.11)
RDW: 16.7 % — ABNORMAL HIGH (ref 11.5–15.5)
WBC: 10.7 10*3/uL — ABNORMAL HIGH (ref 4.0–10.5)

## 2014-05-12 ENCOUNTER — Other Ambulatory Visit (INDEPENDENT_AMBULATORY_CARE_PROVIDER_SITE_OTHER): Payer: BLUE CROSS/BLUE SHIELD | Admitting: *Deleted

## 2014-05-12 DIAGNOSIS — C569 Malignant neoplasm of unspecified ovary: Secondary | ICD-10-CM

## 2014-05-13 LAB — CBC WITH DIFFERENTIAL/PLATELET
BASOS ABS: 0.1 10*3/uL (ref 0.0–0.1)
Basophils Relative: 1 % (ref 0–1)
EOS ABS: 0.1 10*3/uL (ref 0.0–0.7)
EOS PCT: 1 % (ref 0–5)
HCT: 33 % — ABNORMAL LOW (ref 36.0–46.0)
Hemoglobin: 11 g/dL — ABNORMAL LOW (ref 12.0–15.0)
LYMPHS ABS: 2.8 10*3/uL (ref 0.7–4.0)
LYMPHS PCT: 32 % (ref 12–46)
MCH: 33.5 pg (ref 26.0–34.0)
MCHC: 33.3 g/dL (ref 30.0–36.0)
MCV: 100.6 fL — ABNORMAL HIGH (ref 78.0–100.0)
MONO ABS: 0.8 10*3/uL (ref 0.1–1.0)
MPV: 9.5 fL (ref 8.6–12.4)
Monocytes Relative: 9 % (ref 3–12)
Neutro Abs: 5.1 10*3/uL (ref 1.7–7.7)
Neutrophils Relative %: 57 % (ref 43–77)
PLATELETS: 228 10*3/uL (ref 150–400)
RBC: 3.28 MIL/uL — AB (ref 3.87–5.11)
RDW: 15.8 % — AB (ref 11.5–15.5)
WBC: 8.9 10*3/uL (ref 4.0–10.5)

## 2014-05-13 LAB — COMPREHENSIVE METABOLIC PANEL
ALT: 38 U/L — ABNORMAL HIGH (ref 0–35)
AST: 26 U/L (ref 0–37)
Albumin: 4 g/dL (ref 3.5–5.2)
Alkaline Phosphatase: 76 U/L (ref 39–117)
BILIRUBIN TOTAL: 0.3 mg/dL (ref 0.2–1.2)
BUN: 13 mg/dL (ref 6–23)
CHLORIDE: 105 meq/L (ref 96–112)
CO2: 20 meq/L (ref 19–32)
CREATININE: 1.05 mg/dL (ref 0.50–1.10)
Calcium: 9 mg/dL (ref 8.4–10.5)
Glucose, Bld: 97 mg/dL (ref 70–99)
POTASSIUM: 4.2 meq/L (ref 3.5–5.3)
Sodium: 140 mEq/L (ref 135–145)
Total Protein: 6.1 g/dL (ref 6.0–8.3)

## 2014-05-13 LAB — MAGNESIUM: Magnesium: 2 mg/dL (ref 1.5–2.5)

## 2014-05-19 ENCOUNTER — Other Ambulatory Visit (INDEPENDENT_AMBULATORY_CARE_PROVIDER_SITE_OTHER): Payer: BLUE CROSS/BLUE SHIELD | Admitting: *Deleted

## 2014-05-19 ENCOUNTER — Telehealth: Payer: Self-pay | Admitting: *Deleted

## 2014-05-19 DIAGNOSIS — C569 Malignant neoplasm of unspecified ovary: Secondary | ICD-10-CM

## 2014-05-19 LAB — COMPREHENSIVE METABOLIC PANEL
ALT: 63 U/L — ABNORMAL HIGH (ref 0–35)
AST: 36 U/L (ref 0–37)
Albumin: 4.2 g/dL (ref 3.5–5.2)
Alkaline Phosphatase: 73 U/L (ref 39–117)
BUN: 16 mg/dL (ref 6–23)
CHLORIDE: 104 meq/L (ref 96–112)
CO2: 26 mEq/L (ref 19–32)
CREATININE: 0.92 mg/dL (ref 0.50–1.10)
Calcium: 8.9 mg/dL (ref 8.4–10.5)
Glucose, Bld: 76 mg/dL (ref 70–99)
Potassium: 4.4 mEq/L (ref 3.5–5.3)
Sodium: 140 mEq/L (ref 135–145)
Total Bilirubin: 0.4 mg/dL (ref 0.2–1.2)
Total Protein: 6.1 g/dL (ref 6.0–8.3)

## 2014-05-19 LAB — MAGNESIUM: Magnesium: 2.2 mg/dL (ref 1.5–2.5)

## 2014-05-20 LAB — CBC WITH DIFFERENTIAL/PLATELET
Basophils Absolute: 0 10*3/uL (ref 0.0–0.1)
Basophils Relative: 0 % (ref 0–1)
Eosinophils Absolute: 0.2 10*3/uL (ref 0.0–0.7)
Eosinophils Relative: 2 % (ref 0–5)
HCT: 33.2 % — ABNORMAL LOW (ref 36.0–46.0)
Hemoglobin: 11.2 g/dL — ABNORMAL LOW (ref 12.0–15.0)
Lymphocytes Relative: 28 % (ref 12–46)
Lymphs Abs: 2.4 10*3/uL (ref 0.7–4.0)
MCH: 33.5 pg (ref 26.0–34.0)
MCHC: 33.7 g/dL (ref 30.0–36.0)
MCV: 99.4 fL (ref 78.0–100.0)
MPV: 9.1 fL (ref 8.6–12.4)
Monocytes Absolute: 0.6 10*3/uL (ref 0.1–1.0)
Monocytes Relative: 7 % (ref 3–12)
Neutro Abs: 5.4 10*3/uL (ref 1.7–7.7)
Neutrophils Relative %: 63 % (ref 43–77)
Platelets: 177 10*3/uL (ref 150–400)
RBC: 3.34 MIL/uL — ABNORMAL LOW (ref 3.87–5.11)
RDW: 15.3 % (ref 11.5–15.5)
WBC: 8.6 10*3/uL (ref 4.0–10.5)

## 2014-05-20 NOTE — Telephone Encounter (Signed)
Patient wanted to see if we could access her genetic testing results from [facility].  They have not been scanned into the system that we can see.  She will call [facility] for results.

## 2014-05-26 ENCOUNTER — Other Ambulatory Visit (INDEPENDENT_AMBULATORY_CARE_PROVIDER_SITE_OTHER): Payer: BLUE CROSS/BLUE SHIELD | Admitting: *Deleted

## 2014-05-26 DIAGNOSIS — C569 Malignant neoplasm of unspecified ovary: Secondary | ICD-10-CM | POA: Diagnosis not present

## 2014-05-26 LAB — COMPREHENSIVE METABOLIC PANEL
ALT: 42 U/L — ABNORMAL HIGH (ref 0–35)
AST: 28 U/L (ref 0–37)
Albumin: 4.3 g/dL (ref 3.5–5.2)
Alkaline Phosphatase: 73 U/L (ref 39–117)
BUN: 16 mg/dL (ref 6–23)
CO2: 27 meq/L (ref 19–32)
CREATININE: 1.08 mg/dL (ref 0.50–1.10)
Calcium: 9 mg/dL (ref 8.4–10.5)
Chloride: 106 mEq/L (ref 96–112)
Glucose, Bld: 102 mg/dL — ABNORMAL HIGH (ref 70–99)
Potassium: 4.4 mEq/L (ref 3.5–5.3)
Sodium: 140 mEq/L (ref 135–145)
Total Bilirubin: 0.3 mg/dL (ref 0.2–1.2)
Total Protein: 6.1 g/dL (ref 6.0–8.3)

## 2014-05-26 LAB — MAGNESIUM: MAGNESIUM: 2 mg/dL (ref 1.5–2.5)

## 2014-05-27 LAB — CBC WITH DIFFERENTIAL/PLATELET
BASOS PCT: 1 % (ref 0–1)
Basophils Absolute: 0.1 10*3/uL (ref 0.0–0.1)
EOS ABS: 0.2 10*3/uL (ref 0.0–0.7)
EOS PCT: 3 % (ref 0–5)
HCT: 34.2 % — ABNORMAL LOW (ref 36.0–46.0)
HEMOGLOBIN: 11.5 g/dL — AB (ref 12.0–15.0)
LYMPHS ABS: 1.9 10*3/uL (ref 0.7–4.0)
Lymphocytes Relative: 30 % (ref 12–46)
MCH: 33 pg (ref 26.0–34.0)
MCHC: 33.6 g/dL (ref 30.0–36.0)
MCV: 98.3 fL (ref 78.0–100.0)
MONOS PCT: 8 % (ref 3–12)
MPV: 9.1 fL (ref 8.6–12.4)
Monocytes Absolute: 0.5 10*3/uL (ref 0.1–1.0)
Neutro Abs: 3.7 10*3/uL (ref 1.7–7.7)
Neutrophils Relative %: 58 % (ref 43–77)
Platelets: 193 10*3/uL (ref 150–400)
RBC: 3.48 MIL/uL — ABNORMAL LOW (ref 3.87–5.11)
RDW: 14.6 % (ref 11.5–15.5)
WBC: 6.4 10*3/uL (ref 4.0–10.5)

## 2014-06-02 ENCOUNTER — Other Ambulatory Visit (INDEPENDENT_AMBULATORY_CARE_PROVIDER_SITE_OTHER): Payer: BLUE CROSS/BLUE SHIELD | Admitting: *Deleted

## 2014-06-02 DIAGNOSIS — C561 Malignant neoplasm of right ovary: Secondary | ICD-10-CM | POA: Diagnosis not present

## 2014-06-02 NOTE — Progress Notes (Signed)
Patient here for labs only today.

## 2014-06-03 LAB — CBC WITH DIFFERENTIAL/PLATELET
BASOS ABS: 0.1 10*3/uL (ref 0.0–0.1)
Basophils Relative: 1 % (ref 0–1)
EOS PCT: 3 % (ref 0–5)
Eosinophils Absolute: 0.2 10*3/uL (ref 0.0–0.7)
HCT: 34.9 % — ABNORMAL LOW (ref 36.0–46.0)
HEMOGLOBIN: 11.8 g/dL — AB (ref 12.0–15.0)
Lymphocytes Relative: 29 % (ref 12–46)
Lymphs Abs: 1.9 10*3/uL (ref 0.7–4.0)
MCH: 33.6 pg (ref 26.0–34.0)
MCHC: 33.8 g/dL (ref 30.0–36.0)
MCV: 99.4 fL (ref 78.0–100.0)
MPV: 9.1 fL (ref 8.6–12.4)
Monocytes Absolute: 0.4 10*3/uL (ref 0.1–1.0)
Monocytes Relative: 6 % (ref 3–12)
Neutro Abs: 4 10*3/uL (ref 1.7–7.7)
Neutrophils Relative %: 61 % (ref 43–77)
PLATELETS: 228 10*3/uL (ref 150–400)
RBC: 3.51 MIL/uL — AB (ref 3.87–5.11)
RDW: 13.9 % (ref 11.5–15.5)
WBC: 6.5 10*3/uL (ref 4.0–10.5)

## 2014-06-03 LAB — COMPREHENSIVE METABOLIC PANEL
ALT: 39 U/L — ABNORMAL HIGH (ref 0–35)
AST: 25 U/L (ref 0–37)
Albumin: 4.1 g/dL (ref 3.5–5.2)
Alkaline Phosphatase: 80 U/L (ref 39–117)
BUN: 16 mg/dL (ref 6–23)
CO2: 27 mEq/L (ref 19–32)
Calcium: 9 mg/dL (ref 8.4–10.5)
Chloride: 107 mEq/L (ref 96–112)
Creat: 1.04 mg/dL (ref 0.50–1.10)
Glucose, Bld: 108 mg/dL — ABNORMAL HIGH (ref 70–99)
Potassium: 4.5 mEq/L (ref 3.5–5.3)
SODIUM: 137 meq/L (ref 135–145)
TOTAL PROTEIN: 6.2 g/dL (ref 6.0–8.3)
Total Bilirubin: 0.3 mg/dL (ref 0.2–1.2)

## 2014-06-03 LAB — MAGNESIUM: MAGNESIUM: 2.1 mg/dL (ref 1.5–2.5)

## 2014-08-04 ENCOUNTER — Other Ambulatory Visit: Payer: Self-pay | Admitting: Obstetrics & Gynecology

## 2014-08-31 ENCOUNTER — Encounter: Payer: Self-pay | Admitting: *Deleted

## 2014-10-15 ENCOUNTER — Encounter: Payer: Self-pay | Admitting: *Deleted

## 2014-11-02 ENCOUNTER — Encounter: Payer: Self-pay | Admitting: *Deleted

## 2014-11-19 ENCOUNTER — Encounter: Payer: Self-pay | Admitting: *Deleted

## 2014-11-30 ENCOUNTER — Encounter: Payer: Self-pay | Admitting: *Deleted

## 2014-12-03 ENCOUNTER — Encounter: Payer: Self-pay | Admitting: *Deleted

## 2014-12-29 ENCOUNTER — Telehealth: Payer: Self-pay | Admitting: Behavioral Health

## 2014-12-29 NOTE — Telephone Encounter (Signed)
Unable to reach patient at time of Pre-Visit Call.  Left message for patient to return call when available.    

## 2014-12-30 ENCOUNTER — Ambulatory Visit (INDEPENDENT_AMBULATORY_CARE_PROVIDER_SITE_OTHER): Payer: BLUE CROSS/BLUE SHIELD | Admitting: Physician Assistant

## 2014-12-30 ENCOUNTER — Encounter: Payer: Self-pay | Admitting: Physician Assistant

## 2014-12-30 VITALS — BP 104/92 | HR 65 | Temp 97.7°F | Resp 16 | Ht 64.0 in | Wt 171.4 lb

## 2014-12-30 DIAGNOSIS — Z0000 Encounter for general adult medical examination without abnormal findings: Secondary | ICD-10-CM | POA: Diagnosis not present

## 2014-12-30 DIAGNOSIS — Z23 Encounter for immunization: Secondary | ICD-10-CM

## 2014-12-30 DIAGNOSIS — R7989 Other specified abnormal findings of blood chemistry: Secondary | ICD-10-CM | POA: Diagnosis not present

## 2014-12-30 DIAGNOSIS — Z8543 Personal history of malignant neoplasm of ovary: Secondary | ICD-10-CM | POA: Diagnosis not present

## 2014-12-30 LAB — CBC
HCT: 40.2 % (ref 36.0–46.0)
Hemoglobin: 13.5 g/dL (ref 12.0–15.0)
MCHC: 33.7 g/dL (ref 30.0–36.0)
MCV: 93 fl (ref 78.0–100.0)
Platelets: 281 10*3/uL (ref 150.0–400.0)
RBC: 4.32 Mil/uL (ref 3.87–5.11)
RDW: 14.1 % (ref 11.5–15.5)
WBC: 9.4 10*3/uL (ref 4.0–10.5)

## 2014-12-30 LAB — URINALYSIS, ROUTINE W REFLEX MICROSCOPIC
BILIRUBIN URINE: NEGATIVE
HGB URINE DIPSTICK: NEGATIVE
KETONES UR: NEGATIVE
LEUKOCYTES UA: NEGATIVE
NITRITE: NEGATIVE
PH: 7.5 (ref 5.0–8.0)
RBC / HPF: NONE SEEN (ref 0–?)
Specific Gravity, Urine: <=1.005 — AB (ref 1.000–1.030)
Total Protein, Urine: NEGATIVE
Urine Glucose: NEGATIVE
Urobilinogen, UA: 0.2 (ref 0.0–1.0)
WBC UA: NONE SEEN (ref 0–?)

## 2014-12-30 LAB — TSH: TSH: 1.79 u[IU]/mL (ref 0.35–4.50)

## 2014-12-30 LAB — COMPREHENSIVE METABOLIC PANEL
ALT: 34 U/L (ref 0–35)
AST: 24 U/L (ref 0–37)
Albumin: 4.4 g/dL (ref 3.5–5.2)
Alkaline Phosphatase: 109 U/L (ref 39–117)
BUN: 15 mg/dL (ref 6–23)
CHLORIDE: 103 meq/L (ref 96–112)
CO2: 32 meq/L (ref 19–32)
CREATININE: 1.12 mg/dL (ref 0.40–1.20)
Calcium: 10.1 mg/dL (ref 8.4–10.5)
GFR: 53.29 mL/min — ABNORMAL LOW (ref >60.00)
GLUCOSE: 93 mg/dL (ref 70–99)
Potassium: 4.5 mEq/L (ref 3.5–5.1)
SODIUM: 143 meq/L (ref 135–145)
Total Bilirubin: 0.4 mg/dL (ref 0.2–1.2)
Total Protein: 7 g/dL (ref 6.0–8.3)

## 2014-12-30 LAB — VITAMIN D 25 HYDROXY (VIT D DEFICIENCY, FRACTURES): VITD: 24.83 ng/mL — ABNORMAL LOW (ref 30.00–100.00)

## 2014-12-30 LAB — LIPID PANEL
CHOL/HDL RATIO: 3
Cholesterol: 201 mg/dL — ABNORMAL HIGH (ref 0–200)
HDL: 68.5 mg/dL (ref >39.00)
NONHDL: 132.33
Triglycerides: 203 mg/dL — ABNORMAL HIGH (ref 0.0–149.0)
VLDL: 40.6 mg/dL — AB (ref 0.0–40.0)

## 2014-12-30 LAB — LDL CHOLESTEROL, DIRECT: LDL DIRECT: 53 mg/dL

## 2014-12-30 LAB — HEMOGLOBIN A1C: Hgb A1c MFr Bld: 5.3 % (ref 4.6–6.5)

## 2014-12-30 NOTE — Assessment & Plan Note (Signed)
Depression screen negative. Health Maintenance reviewed -- Flu shot updated. Colonoscopy, Mammogram and PAP all up-to-date. Discussed bone density screening but patient refuses wanting to address with her GYN. Encouraged patient to start a Citracal-D supplement. Preventive schedule discussed and handout given in AVS. Will obtain fasting labs today.

## 2014-12-30 NOTE — Assessment & Plan Note (Signed)
Flu shot given today by nursing staff.

## 2014-12-30 NOTE — Assessment & Plan Note (Signed)
Remission. Is on Femara. 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 Will follow.

## 2014-12-30 NOTE — Progress Notes (Signed)
Patient presents to clinic today to establish care.  Acute Concerns: Requesting physical today. Is fasting for labs. No other acute concerns today.  Chronic Issues: Ovarian Cancer -- Followed by [facility] (Dr. Skeet Latch). In remission and doing well. Last CT scan a couple weeks ago which was normal per PT. Is currently on Femara. 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  Insomnia -- Currently on Restoril with good results.  Health Maintenance: Immunizations -- Due for flu shot. Colonoscopy -- up-to-date 11/18/2012 -- Pre-cancerous polyps. Due for repeat next year. Mammogram -- Up-to-date 10/13/2014 -- Benign PAP -- Up-to-date 07/24/13 -- Normal Bone Density -- Overdue. Patient declines presently due to other health issues.  Past Medical History  Diagnosis Date  . Fibroids   . Post menopausal syndrome   . Enlarged uterus   . Hemorrhoids   . Ovarian cancer [facility])     Past Surgical History  Procedure Laterality Date  . Breast lumpectomy      20 years ago. benign / right  . Hand surgery      tissue removed  . Dilation and curettage of uterus      x3  . Endometrial ablation    . Tonsillectomy    . Laparotomy N/A 11/11/2013    Procedure: EXPLORATORY LAPAROTOMY;  Surgeon: Emily Filbert, MD;  Location: [facility];  Service: Gynecology;  Laterality: N/A;  with pelvic washings   . Esophageal biopsy N/A 11/11/2013    Procedure: BIOPSY;  Surgeon: Emily Filbert, MD;  Location: [facility];  Service: Gynecology;  Laterality: N/A;  Right Ovary  . Abdominal hysterectomy      Current Outpatient Prescriptions on File Prior to Visit  Medication Sig Dispense Refill  . ondansetron (ZOFRAN) 8 MG tablet Take 1 tablet (8 mg total) by mouth every 8 (eight) hours as needed for nausea or vomiting. 60 tablet 11   No current facility-administered medications on file prior to visit.    Allergies  Allergen Reactions  . Shellfish Allergy Anaphylaxis  . Band-Aid Liquid  Bandage [New Skin]     Regular band-aids- removes skin, redness  . Codeine Nausea And Vomiting  . Sulfa Antibiotics Other (See Comments)  . Taxol [Paclitaxel] Other (See Comments)    Photosensitivity    Family History  Problem Relation Age of Onset  . Breast cancer Mother 69    Deceased  . Lung cancer Mother   . Stomach cancer Paternal Grandfather     stomach  . Brain cancer Brother 42    brain  . Lung cancer Brother 23    lung/brain  . Lung cancer Father 54    Deceased  . Kidney cancer Father   . Bone cancer Mother   . Brain cancer Mother   . Stroke Paternal Grandmother   . Stroke Maternal Grandmother   . Pancreatic cancer Maternal Aunt   . Melanoma Paternal Aunt   . Melanoma Cousin   . Brain cancer Cousin   . Lung cancer Maternal Aunt   . Hypertension Brother     #3  . Diabetes Brother     #3  . Hypertension Sister     x1  . Fibromyalgia Sister   . Arthritis Sister   . Asthma Daughter     x1  . Allergies Daughter     Social History   Social History  . Marital Status: Married    Spouse Name: N/A  . Number of Children: 1  .  Years of Education: N/A   Occupational History  . Not on file.   Social History Main Topics  . Smoking status: Former Research scientist (life sciences)  . Smokeless tobacco: Never Used     Comment: Quit >30 years  . Alcohol Use: 1.2 oz/week    2 Glasses of wine per week  . Drug Use: No  . Sexual Activity:    Partners: Male    Birth Control/ Protection: None     Comment: tubal blockage confirmed by hystosalpingogram/tn   Other Topics Concern  . Not on file   Social History Narrative   Review of Systems  Constitutional: Negative for fever and weight loss.  HENT: Negative for ear discharge, ear pain, hearing loss and tinnitus.   Eyes: Negative for blurred vision, double vision, photophobia and pain.  Respiratory: Negative for cough and shortness of breath.   Cardiovascular: Negative for chest pain and palpitations.  Gastrointestinal: Negative for  heartburn, nausea, vomiting, abdominal pain, diarrhea, constipation, blood in stool and melena.  Genitourinary: Negative for dysuria, urgency, frequency, hematuria and flank pain.  Musculoskeletal: Negative for falls.  Neurological: Positive for tingling. Negative for dizziness, loss of consciousness and headaches.  Endo/Heme/Allergies: Negative for environmental allergies.  Psychiatric/Behavioral: Negative for depression, suicidal ideas, hallucinations and substance abuse. The patient is not nervous/anxious and does not have insomnia.    BP 104/92 mmHg  Pulse 65  Temp(Src) 97.7 F (36.5 C) (Oral)  Resp 16  Ht 5\' 4"  (1.626 m)  Wt 171 lb 6 oz (77.735 kg)  BMI 29.40 kg/m2  SpO2 100%  Physical Exam  Constitutional: She is oriented to person, place, and time and well-developed, well-nourished, and in no distress.  HENT:  Head: Normocephalic and atraumatic.  Right Ear: Tympanic membrane, external ear and ear canal normal.  Left Ear: Tympanic membrane, external ear and ear canal normal.  Nose: Nose normal. No mucosal edema.  Mouth/Throat: Uvula is midline, oropharynx is clear and moist and mucous membranes are normal. No oropharyngeal exudate or posterior oropharyngeal erythema.  Eyes: Conjunctivae are normal. Pupils are equal, round, and reactive to light.  Neck: Neck supple. No thyromegaly present.  Cardiovascular: Normal rate, regular rhythm, normal heart sounds and intact distal pulses.   Pulmonary/Chest: Effort normal and breath sounds normal. No respiratory distress. She has no wheezes. She has no rales.  Abdominal: Soft. Bowel sounds are normal. She exhibits no distension and no mass. There is no tenderness. There is no rebound and no guarding.  Lymphadenopathy:    She has no cervical adenopathy.  Neurological: She is alert and oriented to person, place, and time. No cranial nerve deficit.  Skin: Skin is warm and dry. No rash noted.  Psychiatric: Affect normal.  Vitals  reviewed.   Recent Results (from the past 2160 hour(s))  Urinalysis, Routine w reflex microscopic (not at [facility])     Status: Abnormal   Collection Time: 12/30/14  9:02 AM  Result Value Ref Range   Color, Urine YELLOW Yellow;Lt. Yellow   APPearance CLEAR Clear   Specific Gravity, Urine <=1.005 (A) 1.000 - 1.030   pH 7.5 5.0 - 8.0   Total Protein, Urine NEGATIVE Negative   Urine Glucose NEGATIVE Negative   Ketones, ur NEGATIVE Negative   Bilirubin Urine NEGATIVE Negative   Hgb urine dipstick NEGATIVE Negative   Urobilinogen, UA 0.2 0.0 - 1.0   Leukocytes, UA NEGATIVE Negative   Nitrite NEGATIVE Negative   WBC, UA none seen 0-2/hpf   RBC / HPF none seen 0-2/hpf  Assessment/Plan: Encounter for immunization Flu shot given today by nursing staff.  Visit for preventive health examination Depression screen negative. Health Maintenance reviewed -- Flu shot updated. Colonoscopy, Mammogram and PAP all up-to-date. Discussed bone density screening but patient refuses wanting to address with her GYN. Encouraged patient to start a Citracal-D supplement. Preventive schedule discussed and handout given in AVS. Will obtain fasting labs today.   Hx of ovarian cancer Remission. Is on Femara. 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 Will follow.

## 2014-12-30 NOTE — Progress Notes (Signed)
Pre visit review using our clinic review tool, if applicable. No additional management support is needed unless otherwise documented below in the visit note/SLS  

## 2014-12-30 NOTE — Patient Instructions (Signed)
Please go to the lab for blood work.  I will call you with your results. If your blood work is normal we will follow-up yearly for physicals.  If anything is abnormal we will treat accordingly and get you in for follow-up.  Please continue chronic medications as directed. Follow-up with Dr. Skeet Latch and Dr. Hulan Fray as scheduled.  Start a Citracal-D supplement daily (can get over-the-counter). Stay active. Let me know when things calm down with your schedule so we can get a bone density scan to assess for osteoporosis.  Preventive Care for Adults, Female A healthy lifestyle and preventive care can promote health and wellness. Preventive health guidelines for women include the following key practices.  A routine yearly physical is a good way to check with your health care provider about your health and preventive screening. It is a chance to share any concerns and updates on your health and to receive a thorough exam.  Visit your dentist for a routine exam and preventive care every 6 months. Brush your teeth twice a day and floss once a day. Good oral hygiene prevents tooth decay and gum disease.  The frequency of eye exams is based on your age, health, family medical history, use of contact lenses, and other factors. Follow your health care provider's recommendations for frequency of eye exams.  Eat a healthy diet. Foods like vegetables, fruits, whole grains, low-fat dairy products, and lean protein foods contain the nutrients you need without too many calories. Decrease your intake of foods high in solid fats, added sugars, and salt. Eat the right amount of calories for you.Get information about a proper diet from your health care provider, if necessary.  Regular physical exercise is one of the most important things you can do for your health. Most adults should get at least 150 minutes of moderate-intensity exercise (any activity that increases your heart rate and causes you to sweat) each  week. In addition, most adults need muscle-strengthening exercises on 2 or more days a week.  Maintain a healthy weight. The body mass index (BMI) is a screening tool to identify possible weight problems. It provides an estimate of body fat based on height and weight. Your health care provider can find your BMI and can help you achieve or maintain a healthy weight.For adults 20 years and older:  A BMI below 18.5 is considered underweight.  A BMI of 18.5 to 24.9 is normal.  A BMI of 25 to 29.9 is considered overweight.  A BMI of 30 and above is considered obese.  Maintain normal blood lipids and cholesterol levels by exercising and minimizing your intake of saturated fat. Eat a balanced diet with plenty of fruit and vegetables. Blood tests for lipids and cholesterol should begin at age 69 and be repeated every 5 years. If your lipid or cholesterol levels are high, you are over 50, or you are at high risk for heart disease, you may need your cholesterol levels checked more frequently.Ongoing high lipid and cholesterol levels should be treated with medicines if diet and exercise are not working.  If you smoke, find out from your health care provider how to quit. If you do not use tobacco, do not start.  Lung cancer screening is recommended for adults aged 54-80 years who are at high risk for developing lung cancer because of a history of smoking. A yearly low-dose CT scan of the lungs is recommended for people who have at least a 30-pack-year history of smoking and are a current  smoker or have quit within the past 15 years. A pack year of smoking is smoking an average of 1 pack of cigarettes a day for 1 year (for example: 1 pack a day for 30 years or 2 packs a day for 15 years). Yearly screening should continue until the smoker has stopped smoking for at least 15 years. Yearly screening should be stopped for people who develop a health problem that would prevent them from having lung cancer  treatment.  If you are pregnant, do not drink alcohol. If you are breastfeeding, be very cautious about drinking alcohol. If you are not pregnant and choose to drink alcohol, do not have more than 1 drink per day. One drink is considered to be 12 ounces (355 mL) of beer, 5 ounces (148 mL) of wine, or 1.5 ounces (44 mL) of liquor.  Avoid use of street drugs. Do not share needles with anyone. Ask for help if you need support or instructions about stopping the use of drugs.  High blood pressure causes heart disease and increases the risk of stroke. Your blood pressure should be checked at least every 1 to 2 years. Ongoing high blood pressure should be treated with medicines if weight loss and exercise do not work.  If you are 52-76 years old, ask your health care provider if you should take aspirin to prevent strokes.  Diabetes screening is done by taking a blood sample to check your blood glucose level after you have not eaten for a certain period of time (fasting). If you are not overweight and you do not have risk factors for diabetes, you should be screened once every 3 years starting at age 22. If you are overweight or obese and you are 51-48 years of age, you should be screened for diabetes every year as part of your cardiovascular risk assessment.  Breast cancer screening is essential preventive care for women. You should practice "breast self-awareness." This means understanding the normal appearance and feel of your breasts and may include breast self-examination. Any changes detected, no matter how small, should be reported to a health care provider. Women in their 14s and 30s should have a clinical breast exam (CBE) by a health care provider as part of a regular health exam every 1 to 3 years. After age 62, women should have a CBE every year. Starting at age 70, women should consider having a mammogram (breast X-ray test) every year. Women who have a family history of breast cancer should talk to  their health care provider about genetic screening. Women at a high risk of breast cancer should talk to their health care providers about having an MRI and a mammogram every year.  Breast cancer gene (BRCA)-related cancer risk assessment is recommended for women who have family members with BRCA-related cancers. BRCA-related cancers include breast, ovarian, tubal, and peritoneal cancers. Having family members with these cancers may be associated with an increased risk for harmful changes (mutations) in the breast cancer genes BRCA1 and BRCA2. Results of the assessment will determine the need for genetic counseling and BRCA1 and BRCA2 testing.  Your health care provider may recommend that you be screened regularly for cancer of the pelvic organs (ovaries, uterus, and vagina). This screening involves a pelvic examination, including checking for microscopic changes to the surface of your cervix (Pap test). You may be encouraged to have this screening done every 3 years, beginning at age 37.  For women ages 30-65, health care providers may recommend pelvic exams  and Pap testing every 3 years, or they may recommend the Pap and pelvic exam, combined with testing for human papilloma virus (HPV), every 5 years. Some types of HPV increase your risk of cervical cancer. Testing for HPV may also be done on women of any age with unclear Pap test results.  Other health care providers may not recommend any screening for nonpregnant women who are considered low risk for pelvic cancer and who do not have symptoms. Ask your health care provider if a screening pelvic exam is right for you.  If you have had past treatment for cervical cancer or a condition that could lead to cancer, you need Pap tests and screening for cancer for at least 20 years after your treatment. If Pap tests have been discontinued, your risk factors (such as having a new sexual partner) need to be reassessed to determine if screening should resume.  Some women have medical problems that increase the chance of getting cervical cancer. In these cases, your health care provider may recommend more frequent screening and Pap tests.  Colorectal cancer can be detected and often prevented. Most routine colorectal cancer screening begins at the age of 69 years and continues through age 45 years. However, your health care provider may recommend screening at an earlier age if you have risk factors for colon cancer. On a yearly basis, your health care provider may provide home test kits to check for hidden blood in the stool. Use of a small camera at the end of a tube, to directly examine the colon (sigmoidoscopy or colonoscopy), can detect the earliest forms of colorectal cancer. Talk to your health care provider about this at age 74, when routine screening begins. Direct exam of the colon should be repeated every 5-10 years through age 2 years, unless early forms of precancerous polyps or small growths are found.  People who are at an increased risk for hepatitis B should be screened for this virus. You are considered at high risk for hepatitis B if:  You were born in a country where hepatitis B occurs often. Talk with your health care provider about which countries are considered high risk.  Your parents were born in a high-risk country and you have not received a shot to protect against hepatitis B (hepatitis B vaccine).  You have HIV or AIDS.  You use needles to inject street drugs.  You live with, or have sex with, someone who has hepatitis B.  You get hemodialysis treatment.  You take certain medicines for conditions like cancer, organ transplantation, and autoimmune conditions.  Hepatitis C blood testing is recommended for all people born from [birth year] through 1963 and any individual with known risks for hepatitis C.  Practice safe sex. Use condoms and avoid high-risk sexual practices to reduce the spread of sexually transmitted infections  (STIs). STIs include gonorrhea, chlamydia, syphilis, trichomonas, herpes, HPV, and human immunodeficiency virus (HIV). Herpes, HIV, and HPV are viral illnesses that have no cure. They can result in disability, cancer, and death.  You should be screened for sexually transmitted illnesses (STIs) including gonorrhea and chlamydia if:  You are sexually active and are younger than 24 years.  You are older than 24 years and your health care provider tells you that you are at risk for this type of infection.  Your sexual activity has changed since you were last screened and you are at an increased risk for chlamydia or gonorrhea. Ask your health care provider if you are at risk.  If you are at risk of being infected with HIV, it is recommended that you take a prescription medicine daily to prevent HIV infection. This is called preexposure prophylaxis (PrEP). You are considered at risk if:  You are sexually active and do not regularly use condoms or know the HIV status of your partner(s).  You take drugs by injection.  You are sexually active with a partner who has HIV.  Talk with your health care provider about whether you are at high risk of being infected with HIV. If you choose to begin PrEP, you should first be tested for HIV. You should then be tested every 3 months for as long as you are taking PrEP.  Osteoporosis is a disease in which the bones lose minerals and strength with aging. This can result in serious bone fractures or breaks. The risk of osteoporosis can be identified using a bone density scan. Women ages 37 years and over and women at risk for fractures or osteoporosis should discuss screening with their health care providers. Ask your health care provider whether you should take a calcium supplement or vitamin D to reduce the rate of osteoporosis.  Menopause can be associated with physical symptoms and risks. Hormone replacement therapy is available to decrease symptoms and risks.  You should talk to your health care provider about whether hormone replacement therapy is right for you.  Use sunscreen. Apply sunscreen liberally and repeatedly throughout the day. You should seek shade when your shadow is shorter than you. Protect yourself by wearing long sleeves, pants, a wide-brimmed hat, and sunglasses year round, whenever you are outdoors.  Once a month, do a whole body skin exam, using a mirror to look at the skin on your back. Tell your health care provider of new moles, moles that have irregular borders, moles that are larger than a pencil eraser, or moles that have changed in shape or color.  Stay current with required vaccines (immunizations).  Influenza vaccine. All adults should be immunized every year.  Tetanus, diphtheria, and acellular pertussis (Td, Tdap) vaccine. Pregnant women should receive 1 dose of Tdap vaccine during each pregnancy. The dose should be obtained regardless of the length of time since the last dose. Immunization is preferred during the 27th-36th week of gestation. An adult who has not previously received Tdap or who does not know her vaccine status should receive 1 dose of Tdap. This initial dose should be followed by tetanus and diphtheria toxoids (Td) booster doses every 10 years. Adults with an unknown or incomplete history of completing a 3-dose immunization series with Td-containing vaccines should begin or complete a primary immunization series including a Tdap dose. Adults should receive a Td booster every 10 years.  Varicella vaccine. An adult without evidence of immunity to varicella should receive 2 doses or a second dose if she has previously received 1 dose. Pregnant females who do not have evidence of immunity should receive the first dose after pregnancy. This first dose should be obtained before leaving the health care facility. The second dose should be obtained 4-8 weeks after the first dose.  Human papillomavirus (HPV) vaccine.  Females aged 13-26 years who have not received the vaccine previously should obtain the 3-dose series. The vaccine is not recommended for use in pregnant females. However, pregnancy testing is not needed before receiving a dose. If a female is found to be pregnant after receiving a dose, no treatment is needed. In that case, the remaining doses should be delayed until  after the pregnancy. Immunization is recommended for any person with an immunocompromised condition through the age of 36 years if she did not get any or all doses earlier. During the 3-dose series, the second dose should be obtained 4-8 weeks after the first dose. The third dose should be obtained 24 weeks after the first dose and 16 weeks after the second dose.  Zoster vaccine. One dose is recommended for adults aged 50 years or older unless certain conditions are present.  Measles, mumps, and rubella (MMR) vaccine. Adults born before [birth year] generally are considered immune to measles and mumps. Adults born in [birth year] or later should have 1 or more doses of MMR vaccine unless there is a contraindication to the vaccine or there is laboratory evidence of immunity to each of the three diseases. A routine second dose of MMR vaccine should be obtained at least 28 days after the first dose for students attending postsecondary schools, health care workers, or international travelers. People who received inactivated measles vaccine or an unknown type of measles vaccine during 1963-1967 should receive 2 doses of MMR vaccine. People who received inactivated mumps vaccine or an unknown type of mumps vaccine before 1979 and are at high risk for mumps infection should consider immunization with 2 doses of MMR vaccine. For females of childbearing age, rubella immunity should be determined. If there is no evidence of immunity, females who are not pregnant should be vaccinated. If there is no evidence of immunity, females who are pregnant should delay immunization  until after pregnancy. Unvaccinated health care workers born before [birth year] who lack laboratory evidence of measles, mumps, or rubella immunity or laboratory confirmation of disease should consider measles and mumps immunization with 2 doses of MMR vaccine or rubella immunization with 1 dose of MMR vaccine.  Pneumococcal 13-valent conjugate (PCV13) vaccine. When indicated, a person who is uncertain of his immunization history and has no record of immunization should receive the PCV13 vaccine. All adults 58 years of age and older should receive this vaccine. An adult aged 67 years or older who has certain medical conditions and has not been previously immunized should receive 1 dose of PCV13 vaccine. This PCV13 should be followed with a dose of pneumococcal polysaccharide (PPSV23) vaccine. Adults who are at high risk for pneumococcal disease should obtain the PPSV23 vaccine at least 8 weeks after the dose of PCV13 vaccine. Adults older than 57 years of age who have normal immune system function should obtain the PPSV23 vaccine dose at least 1 year after the dose of PCV13 vaccine.  Pneumococcal polysaccharide (PPSV23) vaccine. When PCV13 is also indicated, PCV13 should be obtained first. All adults aged 31 years and older should be immunized. An adult younger than age 83 years who has certain medical conditions should be immunized. Any person who resides in a nursing home or long-term care facility should be immunized. An adult smoker should be immunized. People with an immunocompromised condition and certain other conditions should receive both PCV13 and PPSV23 vaccines. People with human immunodeficiency virus (HIV) infection should be immunized as soon as possible after diagnosis. Immunization during chemotherapy or radiation therapy should be avoided. Routine use of PPSV23 vaccine is not recommended for American Indians, [place] Natives, or people younger than 65 years unless there are medical conditions that  require PPSV23 vaccine. When indicated, people who have unknown immunization and have no record of immunization should receive PPSV23 vaccine. One-time revaccination 5 years after the first dose of PPSV23 is recommended for people  aged 19-64 years who have chronic kidney failure, nephrotic syndrome, asplenia, or immunocompromised conditions. People who received 1-2 doses of PPSV23 before age 19 years should receive another dose of PPSV23 vaccine at age 31 years or later if at least 5 years have passed since the previous dose. Doses of PPSV23 are not needed for people immunized with PPSV23 at or after age 76 years.  Meningococcal vaccine. Adults with asplenia or persistent complement component deficiencies should receive 2 doses of quadrivalent meningococcal conjugate (MenACWY-D) vaccine. The doses should be obtained at least 2 months apart. Microbiologists working with certain meningococcal bacteria, [employer] recruits, people at risk during an outbreak, and people who travel to or live in countries with a high rate of meningitis should be immunized. A first-year college student up through age 73 years who is living in a residence hall should receive a dose if she did not receive a dose on or after her 16th birthday. Adults who have certain high-risk conditions should receive one or more doses of vaccine.  Hepatitis A vaccine. Adults who wish to be protected from this disease, have certain high-risk conditions, work with hepatitis A-infected animals, work in hepatitis A research labs, or travel to or work in countries with a high rate of hepatitis A should be immunized. Adults who were previously unvaccinated and who anticipate close contact with an international adoptee during the first 60 days after arrival in the [place] from a country with a high rate of hepatitis A should be immunized.  Hepatitis B vaccine. Adults who wish to be protected from this disease, have certain high-risk conditions, may be  exposed to blood or other infectious body fluids, are household contacts or sex partners of hepatitis B positive people, are clients or workers in certain care facilities, or travel to or work in countries with a high rate of hepatitis B should be immunized.  Haemophilus influenzae type b (Hib) vaccine. A previously unvaccinated person with asplenia or sickle cell disease or having a scheduled splenectomy should receive 1 dose of Hib vaccine. Regardless of previous immunization, a recipient of a hematopoietic stem cell transplant should receive a 3-dose series 6-12 months after her successful transplant. Hib vaccine is not recommended for adults with HIV infection. Preventive Services / Frequency Ages 76 to 47 years  Blood pressure check.** / Every 3-5 years.  Lipid and cholesterol check.** / Every 5 years beginning at age 25.  Clinical breast exam.** / Every 3 years for women in their 36s and 23s.  BRCA-related cancer risk assessment.** / For women who have family members with a BRCA-related cancer (breast, ovarian, tubal, or peritoneal cancers).  Pap test.** / Every 2 years from ages 3 through 58. Every 3 years starting at age 29 through age 43 or 68 with a history of 3 consecutive normal Pap tests.  HPV screening.** / Every 3 years from ages 39 through ages 55 to 59 with a history of 3 consecutive normal Pap tests.  Hepatitis C blood test.** / For any individual with known risks for hepatitis C.  Skin self-exam. / Monthly.  Influenza vaccine. / Every year.  Tetanus, diphtheria, and acellular pertussis (Tdap, Td) vaccine.** / Consult your health care provider. Pregnant women should receive 1 dose of Tdap vaccine during each pregnancy. 1 dose of Td every 10 years.  Varicella vaccine.** / Consult your health care provider. Pregnant females who do not have evidence of immunity should receive the first dose after pregnancy.  HPV vaccine. / 3 doses  over 6 months, if 54 and younger. The  vaccine is not recommended for use in pregnant females. However, pregnancy testing is not needed before receiving a dose.  Measles, mumps, rubella (MMR) vaccine.** / You need at least 1 dose of MMR if you were born in 1957 or later. You may also need a 2nd dose. For females of childbearing age, rubella immunity should be determined. If there is no evidence of immunity, females who are not pregnant should be vaccinated. If there is no evidence of immunity, females who are pregnant should delay immunization until after pregnancy.  Pneumococcal 13-valent conjugate (PCV13) vaccine.** / Consult your health care provider.  Pneumococcal polysaccharide (PPSV23) vaccine.** / 1 to 2 doses if you smoke cigarettes or if you have certain conditions.  Meningococcal vaccine.** / 1 dose if you are age 81 to 44 years and a Market researcher living in a residence hall, or have one of several medical conditions, you need to get vaccinated against meningococcal disease. You may also need additional booster doses.  Hepatitis A vaccine.** / Consult your health care provider.  Hepatitis B vaccine.** / Consult your health care provider.  Haemophilus influenzae type b (Hib) vaccine.** / Consult your health care provider. Ages 57 to 26 years  Blood pressure check.** / Every year.  Lipid and cholesterol check.** / Every 5 years beginning at age 55 years.  Lung cancer screening. / Every year if you are aged 66-80 years and have a 30-pack-year history of smoking and currently smoke or have quit within the past 15 years. Yearly screening is stopped once you have quit smoking for at least 15 years or develop a health problem that would prevent you from having lung cancer treatment.  Clinical breast exam.** / Every year after age 9 years.  BRCA-related cancer risk assessment.** / For women who have family members with a BRCA-related cancer (breast, ovarian, tubal, or peritoneal cancers).  Mammogram.** / Every  year beginning at age 72 years and continuing for as long as you are in good health. Consult with your health care provider.  Pap test.** / Every 3 years starting at age 40 years through age 38 or 72 years with a history of 3 consecutive normal Pap tests.  HPV screening.** / Every 3 years from ages 12 years through ages 23 to 45 years with a history of 3 consecutive normal Pap tests.  Fecal occult blood test (FOBT) of stool. / Every year beginning at age 1 years and continuing until age 51 years. You may not need to do this test if you get a colonoscopy every 10 years.  Flexible sigmoidoscopy or colonoscopy.** / Every 5 years for a flexible sigmoidoscopy or every 10 years for a colonoscopy beginning at age 40 years and continuing until age 27 years.  Hepatitis C blood test.** / For all people born from [birth year] through 1963 and any individual with known risks for hepatitis C.  Skin self-exam. / Monthly.  Influenza vaccine. / Every year.  Tetanus, diphtheria, and acellular pertussis (Tdap/Td) vaccine.** / Consult your health care provider. Pregnant women should receive 1 dose of Tdap vaccine during each pregnancy. 1 dose of Td every 10 years.  Varicella vaccine.** / Consult your health care provider. Pregnant females who do not have evidence of immunity should receive the first dose after pregnancy.  Zoster vaccine.** / 1 dose for adults aged 39 years or older.  Measles, mumps, rubella (MMR) vaccine.** / You need at least 1 dose of MMR if  you were born in [birth year] or later. You may also need a second dose. For females of childbearing age, rubella immunity should be determined. If there is no evidence of immunity, females who are not pregnant should be vaccinated. If there is no evidence of immunity, females who are pregnant should delay immunization until after pregnancy.  Pneumococcal 13-valent conjugate (PCV13) vaccine.** / Consult your health care provider.  Pneumococcal polysaccharide (PPSV23)  vaccine.** / 1 to 2 doses if you smoke cigarettes or if you have certain conditions.  Meningococcal vaccine.** / Consult your health care provider.  Hepatitis A vaccine.** / Consult your health care provider.  Hepatitis B vaccine.** / Consult your health care provider.  Haemophilus influenzae type b (Hib) vaccine.** / Consult your health care provider. Ages 54 years and over  Blood pressure check.** / Every year.  Lipid and cholesterol check.** / Every 5 years beginning at age 91 years.  Lung cancer screening. / Every year if you are aged 39-80 years and have a 30-pack-year history of smoking and currently smoke or have quit within the past 15 years. Yearly screening is stopped once you have quit smoking for at least 15 years or develop a health problem that would prevent you from having lung cancer treatment.  Clinical breast exam.** / Every year after age 62 years.  BRCA-related cancer risk assessment.** / For women who have family members with a BRCA-related cancer (breast, ovarian, tubal, or peritoneal cancers).  Mammogram.** / Every year beginning at age 74 years and continuing for as long as you are in good health. Consult with your health care provider.  Pap test.** / Every 3 years starting at age 76 years through age 23 or 60 years with 3 consecutive normal Pap tests. Testing can be stopped between 65 and 70 years with 3 consecutive normal Pap tests and no abnormal Pap or HPV tests in the past 10 years.  HPV screening.** / Every 3 years from ages 56 years through ages 73 or 56 years with a history of 3 consecutive normal Pap tests. Testing can be stopped between 65 and 70 years with 3 consecutive normal Pap tests and no abnormal Pap or HPV tests in the past 10 years.  Fecal occult blood test (FOBT) of stool. / Every year beginning at age 50 years and continuing until age 76 years. You may not need to do this test if you get a colonoscopy every 10 years.  Flexible sigmoidoscopy  or colonoscopy.** / Every 5 years for a flexible sigmoidoscopy or every 10 years for a colonoscopy beginning at age 40 years and continuing until age 40 years.  Hepatitis C blood test.** / For all people born from [birth year] through 1963 and any individual with known risks for hepatitis C.  Osteoporosis screening.** / A one-time screening for women ages 4 years and over and women at risk for fractures or osteoporosis.  Skin self-exam. / Monthly.  Influenza vaccine. / Every year.  Tetanus, diphtheria, and acellular pertussis (Tdap/Td) vaccine.** / 1 dose of Td every 10 years.  Varicella vaccine.** / Consult your health care provider.  Zoster vaccine.** / 1 dose for adults aged 34 years or older.  Pneumococcal 13-valent conjugate (PCV13) vaccine.** / Consult your health care provider.  Pneumococcal polysaccharide (PPSV23) vaccine.** / 1 dose for all adults aged 63 years and older.  Meningococcal vaccine.** / Consult your health care provider.  Hepatitis A vaccine.** / Consult your health care provider.  Hepatitis B vaccine.** / Consult your health care  provider.  Haemophilus influenzae type b (Hib) vaccine.** / Consult your health care provider. ** Family history and personal history of risk and conditions may change your health care provider's recommendations.   This information is not intended to replace advice given to you by your health care provider. Make sure you discuss any questions you have with your health care provider.   Document Released: 04/18/2001 Document Revised: 03/13/2014 Document Reviewed: 07/18/2010 Elsevier Interactive Patient Education [employer].

## 2015-01-01 ENCOUNTER — Encounter: Payer: Self-pay | Admitting: Physician Assistant

## 2015-04-12 ENCOUNTER — Encounter: Payer: Self-pay | Admitting: *Deleted

## 2015-04-21 ENCOUNTER — Encounter: Payer: Self-pay | Admitting: *Deleted

## 2015-07-12 ENCOUNTER — Encounter: Payer: Self-pay | Admitting: *Deleted

## 2015-08-16 ENCOUNTER — Ambulatory Visit: Payer: BLUE CROSS/BLUE SHIELD | Admitting: Gastroenterology

## 2015-09-21 ENCOUNTER — Encounter: Payer: Self-pay | Admitting: Gastroenterology

## 2015-09-21 ENCOUNTER — Ambulatory Visit (INDEPENDENT_AMBULATORY_CARE_PROVIDER_SITE_OTHER): Payer: Managed Care, Other (non HMO) | Admitting: Gastroenterology

## 2015-09-21 ENCOUNTER — Ambulatory Visit: Payer: BLUE CROSS/BLUE SHIELD | Admitting: Gastroenterology

## 2015-09-21 VITALS — BP 158/90 | HR 68 | Ht 64.0 in | Wt 172.6 lb

## 2015-09-21 DIAGNOSIS — Z8543 Personal history of malignant neoplasm of ovary: Secondary | ICD-10-CM | POA: Diagnosis not present

## 2015-09-21 DIAGNOSIS — Z86010 Personal history of colonic polyps: Secondary | ICD-10-CM | POA: Diagnosis not present

## 2015-09-21 DIAGNOSIS — K5900 Constipation, unspecified: Secondary | ICD-10-CM | POA: Insufficient documentation

## 2015-09-21 DIAGNOSIS — K5901 Slow transit constipation: Secondary | ICD-10-CM

## 2015-09-21 MED ORDER — NA SULFATE-K SULFATE-MG SULF 17.5-3.13-1.6 GM/177ML PO SOLN: 1.0000 | Freq: Once | ORAL | Status: DC

## 2015-09-21 NOTE — Patient Instructions (Signed)
If you are age 58 or older, your body mass index should be between 23-30. Your Body mass index is 29.61 kg/(m^2). If this is out of the aforementioned range listed, please consider follow up with your Primary Care Provider.  If you are age 48 or younger, your body mass index should be between 19-25. Your Body mass index is 29.61 kg/(m^2). If this is out of the aformentioned range listed, please consider follow up with your Primary Care Provider.   You have been scheduled for a colonoscopy. Please follow written instructions given to you at your visit today.  Please pick up your prep supplies at the pharmacy within the next 1-3 days. If you use inhalers (even only as needed), please bring them with you on the day of your procedure. Your physician has requested that you go to www.startemmi.com and enter the access code given to you at your visit today. This web site gives a general overview about your procedure. However, you should still follow specific instructions given to you by our office regarding your preparation for the procedure.  Thank you for choosing [facility]  Dr Wilfrid Lund III

## 2015-09-21 NOTE — Progress Notes (Signed)
[facility] Progress Note  Chief Complaint: Constipation  Subjective History:  Kerri Whitaker is here for her first visit since a screening colonoscopy with Dr. Deatra Ina in September 2014. Several subcentimeter polyps were removed, pathology as below. She was diagnosed with ovarian cancer in 2015, and underwent surgery, systemic chemotherapy, and intraperitoneal chemotherapy. That led to constipation which has slowly subsided over the years. She still requires daily stool softener and do collapse, time still has some hard stool with a need to strain. With that, they will be occasional rectal bleeding that she attributes to hemorrhoids.  ROS: Cardiovascular:  no chest pain Respiratory: no dyspnea  The patient's Past Medical, Family and Social History were reviewed and are on file in the EMR.  Objective:  Med list reviewed  Vital signs in last 24 hrs: Filed Vitals:   09/21/15 0830  BP: 158/90  Pulse: 68    Physical Exam   HEENT: sclera anicteric, oral mucosa moist without lesions  Neck: supple, no thyromegaly, JVD or lymphadenopathy  Cardiac: RRR without murmurs, S1S2 heard, no peripheral edema. She has a port in the right upper chest wall menses plans to have it removed in the next couple of months)  Pulm: clear to auscultation bilaterally, normal RR and effort noted  Abdomen: soft, No tenderness, with active bowel sounds. No guarding or palpable hepatosplenomegaly.  Skin; warm and dry, no jaundice or rash  Recent Labs: Polyp pathology 11/2012 - SSP and TA and TVA    @ASSESSMENTPLANBEGIN @ Assessment: Encounter Diagnoses  Name Primary?  . Slow transit constipation Yes  . Personal history of colonic polyps   . Hx of ovarian cancer     Slow transit constipation, probably related to previous intraperitoneal chemotherapy. Her SSRI might also be contribute eating.  Plan: As needed use of MiraLAX, adequate water intake and I Terri fiber and exercise.  Surveillance colonoscopy  for history of polyps.  Total time 20 minutes, over half spent reviewing records counseling and Court meeting care, including discussion of constipation and natural history of colon polyps.  Nelida Meuse III

## 2015-10-07 ENCOUNTER — Ambulatory Visit (AMBULATORY_SURGERY_CENTER): Payer: Managed Care, Other (non HMO) | Admitting: Gastroenterology

## 2015-10-07 ENCOUNTER — Encounter: Payer: Self-pay | Admitting: Gastroenterology

## 2015-10-07 VITALS — BP 122/88 | HR 86 | Temp 98.4°F | Resp 14 | Ht 64.0 in | Wt 172.0 lb

## 2015-10-07 DIAGNOSIS — Z86010 Personal history of colonic polyps: Secondary | ICD-10-CM

## 2015-10-07 DIAGNOSIS — D125 Benign neoplasm of sigmoid colon: Secondary | ICD-10-CM | POA: Diagnosis not present

## 2015-10-07 MED ORDER — SODIUM CHLORIDE 0.9 % IV SOLN: 500.0000 mL | INTRAVENOUS | Status: AC

## 2015-10-07 NOTE — Op Note (Signed)
[facility] Patient Name: Kerri Whitaker Procedure Date: 10/07/2015 2:00 PM MRN: [id number] Endoscopist: Mallie Mussel L. Loletha Carrow , MD Age: 60 Referring MD:  Date of Birth: 1955/06/14 Gender: Female Account #: [id number] Procedure:                Colonoscopy Indications:              High risk colon cancer surveillance: Personal                            history of colonic polyps (SSP and TA , sub cm, in                            2014) Medicines:                Monitored Anesthesia Care Procedure:                Pre-Anesthesia Assessment:                           - Prior to the procedure, a History and Physical                            was performed, and patient medications and                            allergies were reviewed. The patient's tolerance of                            previous anesthesia was also reviewed. The risks                            and benefits of the procedure and the sedation                            options and risks were discussed with the patient.                            All questions were answered, and informed consent                            was obtained. Prior Anticoagulants: The patient has                            taken no previous anticoagulant or antiplatelet                            agents. ASA Grade Assessment: II - A patient with                            mild systemic disease. After reviewing the risks                            and benefits, the patient was deemed in  satisfactory condition to undergo the procedure.                           After obtaining informed consent, the colonoscope                            was passed under direct vision. Throughout the                            procedure, the patient's blood pressure, pulse, and                            oxygen saturations were monitored continuously. The                            Model CF-HQ190L [id number]) scope was introduced                      through the anus and advanced to the the cecum,                            identified by appendiceal orifice and ileocecal                            valve. The colonoscopy was performed without                            difficulty. The patient tolerated the procedure                            well. The quality of the bowel preparation was                            excellent. The ileocecal valve, appendiceal                            orifice, and rectum were photographed. The bowel                            preparation used was SUPREP. Scope In: 2:19:38 PM Scope Out: 2:34:12 PM Scope Withdrawal Time: 0 hours 10 minutes 28 seconds  Total Procedure Duration: 0 hours 14 minutes 34 seconds  Findings:                 The perianal and digital rectal examinations were                            normal.                           A 8 mm polyp was found in the mid sigmoid colon.                            The polyp was semi-pedunculated. The polyp was  removed with a hot snare. Resection and retrieval                            were complete.                           Internal hemorrhoids were found during                            retroflexion. The hemorrhoids were large and Grade                            II (internal hemorrhoids that prolapse but reduce                            spontaneously).                           The exam was otherwise without abnormality on                            direct and retroflexion views.                           An area of melanosis was found from cecum to                            hepatic flexure. Complications:            No immediate complications. Estimated Blood Loss:     Estimated blood loss: none. Impression:               - One 8 mm polyp in the mid sigmoid colon, removed                            with a hot snare. Resected and retrieved.                           - Internal hemorrhoids.                            - The examination was otherwise normal on direct                            and retroflexion views.                           - Melanosis in the colon. Recommendation:           - Patient has a contact number available for                            emergencies. The signs and symptoms of potential                            delayed complications were discussed with the  patient. Return to normal activities tomorrow.                            Written discharge instructions were provided to the                            patient.                           - Resume previous diet.                           - Continue present medications.                           - Await pathology results.                           - Repeat colonoscopy is recommended for                            surveillance. The colonoscopy date will be                            determined after pathology results from today's                            exam become available for review.                           - Return to GI clinic to perform hemorrhoidal                            banding. Henry L. Loletha Carrow, MD 10/07/2015 2:41:25 PM This report has been signed electronically.

## 2015-10-07 NOTE — Progress Notes (Signed)
Report to PACU, RN, vss, BBS= Clear.  

## 2015-10-07 NOTE — Progress Notes (Addendum)
Note placed in error

## 2015-10-07 NOTE — Progress Notes (Signed)
Called to room to assist during endoscopic procedure.  Patient ID and intended procedure confirmed with present staff. Received instructions for my participation in the procedure from the performing physician.  

## 2015-10-07 NOTE — Patient Instructions (Signed)
YOU HAD AN ENDOSCOPIC PROCEDURE TODAY AT [facility]:   Refer to the procedure report that was given to you for any specific questions about what was found during the examination.  If the procedure report does not answer your questions, please call your gastroenterologist to clarify.  If you requested that your care partner not be given the details of your procedure findings, then the procedure report has been included in a sealed envelope for you to review at your convenience later.  YOU SHOULD EXPECT: Some feelings of bloating in the abdomen. Passage of more gas than usual.  Walking can help get rid of the air that was put into your GI tract during the procedure and reduce the bloating. If you had a lower endoscopy (such as a colonoscopy or flexible sigmoidoscopy) you may notice spotting of blood in your stool or on the toilet paper. If you underwent a bowel prep for your procedure, you may not have a normal bowel movement for a few days.  Please Note:  You might notice some irritation and congestion in your nose or some drainage.  This is from the oxygen used during your procedure.  There is no need for concern and it should clear up in a day or so.  SYMPTOMS TO REPORT IMMEDIATELY:   Following lower endoscopy (colonoscopy or flexible sigmoidoscopy):  Excessive amounts of blood in the stool  Significant tenderness or worsening of abdominal pains  Swelling of the abdomen that is new, acute  Fever of 100F or higher  For urgent or emergent issues, a gastroenterologist can be reached at any hour by calling [phone].   DIET: Your first meal following the procedure should be a small meal and then it is ok to progress to your normal diet. Heavy or fried foods are harder to digest and may make you feel nauseous or bloated.  Likewise, meals heavy in dairy and vegetables can increase bloating.  Drink plenty of fluids but you should avoid alcoholic beverages for 24  hours.  ACTIVITY:  You should plan to take it easy for the rest of today and you should NOT DRIVE or use heavy machinery until tomorrow (because of the sedation medicines used during the test).    FOLLOW UP: Our staff will call the number listed on your records the next business day following your procedure to check on you and address any questions or concerns that you may have regarding the information given to you following your procedure. If we do not reach you, we will leave a message.  However, if you are feeling well and you are not experiencing any problems, there is no need to return our call.  We will assume that you have returned to your regular daily activities without incident.  If any biopsies were taken you will be contacted by phone or by letter within the next 1-3 weeks.  Please call us at [phone] if you have not heard about the biopsies in 3 weeks.   Please read polyp and hemorrhoid handouts provided.   SIGNATURES/CONFIDENTIALITY: You and/or your care partner have signed paperwork which will be entered into your electronic medical record.  These signatures attest to the fact that that the information above on your After Visit Summary has been reviewed and is understood.  Full responsibility of the confidentiality of this discharge information lies with you and/or your care-partner.

## 2015-10-08 ENCOUNTER — Telehealth: Payer: Self-pay | Admitting: *Deleted

## 2015-10-08 NOTE — Telephone Encounter (Signed)
No answer, left message to call if questions or concerns. 

## 2015-10-14 ENCOUNTER — Other Ambulatory Visit: Payer: Self-pay | Admitting: Oncology

## 2015-10-14 ENCOUNTER — Encounter: Payer: Self-pay | Admitting: Gastroenterology

## 2015-10-28 ENCOUNTER — Ambulatory Visit (INDEPENDENT_AMBULATORY_CARE_PROVIDER_SITE_OTHER): Payer: Managed Care, Other (non HMO) | Admitting: Obstetrics & Gynecology

## 2015-10-28 ENCOUNTER — Encounter: Payer: Self-pay | Admitting: Obstetrics & Gynecology

## 2015-10-28 VITALS — BP 157/83 | HR 76 | Resp 18 | Ht 64.0 in | Wt 171.0 lb

## 2015-10-28 DIAGNOSIS — Z01419 Encounter for gynecological examination (general) (routine) without abnormal findings: Secondary | ICD-10-CM

## 2015-10-28 DIAGNOSIS — Z124 Encounter for screening for malignant neoplasm of cervix: Secondary | ICD-10-CM

## 2015-10-28 NOTE — Progress Notes (Signed)
Subjective:    Kerri Whitaker is a 60 y.o. MW P1 (56 yo daughter)   female who presents for an annual exam. The patient has no complaints today. The patient is sexually active. However she is finding the dryness to be painful, trying various lubricants.  GYN screening history: last pap: was normal. The patient wears seatbelts: yes. The patient participates in regular exercise: yes. Has the patient ever been transfused or tattooed?: no. The patient reports that there is not domestic violence in her life.   Menstrual History: OB History    Gravida Para Term Preterm AB Living   1 1 1     1    SAB TAB Ectopic Multiple Live Births                  Menarche age: 67 No LMP recorded. Patient has had a hysterectomy.    The following portions of the patient's history were reviewed and updated as appropriate: allergies, current medications, past family history, past medical history, past social history, past surgical history and problem list.  Review of Systems Pertinent items are noted in HPI.  Married for 30 years, monogamous for 36 years. 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 Mammogram due. Colonscopy UTD, precancerous, repeat in 3 years.    Objective:    BP (!) 157/83 (BP Location: Left Arm, Patient Position: Sitting, Cuff Size: Normal)   Pulse 76   Resp 18   Ht 5\' 4"  (1.626 m)   Wt 171 lb (77.6 kg)   BMI 29.35 kg/m   General Appearance:    Alert, cooperative, no distress, appears stated age  Head:    Normocephalic, without obvious abnormality, atraumatic  Eyes:    PERRL, conjunctiva/corneas clear, EOM's intact, fundi    benign, both eyes  Ears:    Normal TM's and external ear canals, both ears  Nose:   Nares normal, septum midline, mucosa normal, no drainage    or sinus tenderness  Throat:   Lips, mucosa, and tongue normal; teeth and gums normal  Neck:   Supple, symmetrical, trachea midline, no adenopathy;    thyroid:  no enlargement/tenderness/nodules; no carotid  bruit or JVD  Back:     Symmetric, no curvature, ROM normal, no CVA tenderness  Lungs:     Clear to auscultation bilaterally, respirations unlabored  Chest Wall:    No tenderness or deformity   Heart:    Regular rate and rhythm, S1 and S2 normal, no murmur, rub   or gallop  Breast Exam:    No tenderness, masses, or nipple abnormality  Abdomen:     Soft, non-tender, bowel sounds active all four quadrants,    no masses, no organomegaly  Genitalia:    Normal female without lesion, discharge or tenderness, normal vaginal cuff, bimanual exam with no masses     Extremities:   Extremities normal, atraumatic, no cyanosis or edema  Pulses:   2+ and symmetric all extremities  Skin:   Skin color, texture, turgor normal, no rashes or lesions  Lymph nodes:   Cervical, supraclavicular, and axillary nodes normal  Neurologic:   CNII-XII intact, normal strength, sensation and reflexes    throughout   .    Assessment:    Healthy female exam.    Plan:     Thin prep Pap smear. with cotesting I will send Dr. Denman George a message about vaginal estrogen

## 2015-10-29 LAB — CYTOLOGY - PAP

## 2015-11-16 ENCOUNTER — Other Ambulatory Visit: Payer: Self-pay | Admitting: Obstetrics & Gynecology

## 2015-11-16 DIAGNOSIS — Z1231 Encounter for screening mammogram for malignant neoplasm of breast: Secondary | ICD-10-CM

## 2015-11-23 ENCOUNTER — Ambulatory Visit
Admission: RE | Admit: 2015-11-23 | Discharge: 2015-11-23 | Disposition: A | Discharge status: Home / Self Care | Payer: Managed Care, Other (non HMO) | Source: Ambulatory Visit | Attending: Obstetrics & Gynecology | Admitting: Obstetrics & Gynecology

## 2015-11-23 DIAGNOSIS — Z1231 Encounter for screening mammogram for malignant neoplasm of breast: Secondary | ICD-10-CM

## 2015-11-30 ENCOUNTER — Ambulatory Visit (INDEPENDENT_AMBULATORY_CARE_PROVIDER_SITE_OTHER): Payer: Managed Care, Other (non HMO) | Admitting: Gastroenterology

## 2015-11-30 ENCOUNTER — Encounter: Payer: Self-pay | Admitting: Gastroenterology

## 2015-11-30 VITALS — BP 146/80 | HR 68 | Ht 64.0 in | Wt 171.2 lb

## 2015-11-30 DIAGNOSIS — K5901 Slow transit constipation: Secondary | ICD-10-CM

## 2015-11-30 DIAGNOSIS — K594 Anal spasm: Secondary | ICD-10-CM | POA: Diagnosis not present

## 2015-11-30 MED ORDER — AMBULATORY NON FORMULARY MEDICATION: 0 refills | Status: AC

## 2015-11-30 NOTE — Progress Notes (Signed)
[facility] Progress Note  Chief Complaint: constipation, internal hemorrhoids  Subjective  History:  Painless bleeding about once a month, no anal pain Still troubled by constipation, straining, hard stool.  ROS: Cardiovascular:  no chest pain Respiratory: no dyspnea  The patient's Past Medical, Family and Social History were reviewed and are on file in the EMR.  Objective:  Med list reviewed  Vital signs in last 24 hrs: Vitals:   11/30/15 0924  BP: (!) 146/80  Pulse: 68    Physical Exam   Rectal: anal spasm, no fissure  Recent Labs:  TA polyp on recent colonoscopy   @ASSESSMENTPLANBEGIN @ Assessment: Encounter Diagnoses  Name Primary?  . Slow transit constipation Yes  . Anal spasm       Plan:  Trial samples Linzess 72 mcg once daily OV 3 weeks for anoscopy and possible banding  Total time 15 minutes, over half spent in counseling and coordination of care.   Nelida Meuse III

## 2015-11-30 NOTE — Patient Instructions (Addendum)
HEMORRHOID BANDING PROCEDURE    FOLLOW-UP CARE   1. The procedure you have had should have been relatively painless since the banding of the area involved does not have nerve endings and there is no pain sensation.  The rubber band cuts off the blood supply to the hemorrhoid and the band may fall off as soon as 48 hours after the banding (the band may occasionally be seen in the toilet bowl following a bowel movement). You may notice a temporary feeling of fullness in the rectum which should respond adequately to plain Tylenol or Motrin.  2. Following the banding, avoid strenuous exercise that evening and resume full activity the next day.  A sitz bath (soaking in a warm tub) or bidet is soothing, and can be useful for cleansing the area after bowel movements.     3. To avoid constipation, take two tablespoons of natural wheat bran, natural oat bran, flax, Benefiber or any over the counter fiber supplement and increase your water intake to 7-8 glasses daily.    4. Unless you have been prescribed anorectal medication, do not put anything inside your rectum for two weeks: No suppositories, enemas, fingers, etc.  5. Occasionally, you may have more bleeding than usual after the banding procedure.  This is often from the untreated hemorrhoids rather than the treated one.  Don't be concerned if there is a tablespoon or so of blood.  If there is more blood than this, lie flat with your bottom higher than your head and apply an ice pack to the area. If the bleeding does not stop within a half an hour or if you feel faint, call our office at [phone] or go to the emergency room.  6. Problems are not common; however, if there is a substantial amount of bleeding, severe pain, chills, fever or difficulty passing urine (very rare) or other problems, you should call us at [phone] or report to the nearest emergency room.  7. Do not stay seated continuously for more than 2-3 hours for a day or two  after the procedure.  Tighten your buttock muscles 10-15 times every two hours and take 10-15 deep breaths every 1-2 hours.  Do not spend more than a few minutes on the toilet if you cannot empty your bowel; instead re-visit the toilet at a later time.  Patient Drug Education for Nitroglycerin Ointment  Nitroglycerin ointment (NTG) is used to help heal anal fissures. The ointment relaxes the smooth muscle around the anus and promotes blood flow which helps heal the fissure (tear). The NTG reduces anal canal pressure, which diminishes pain and spasm. We use a diluted concentration of NTG (.125%) compared to the 2% that is typically used for heart patients, and this is why you need to obtain the medication from a pharmacy which will compound your prescription.  The NTG ointment should be applied 3 times per day, or as directed.  A pea-sized drop should be placed on the tip of your finger and then gently placed inside the anus. The finger should be inserted 1/3 - 1/2 its length and may be covered with a plastic glove or finger cot. You may use Vaseline to help coat the finger or dilute the ointment. If you are advised to mix the NTG with steroid ointment, limit the steroids to one to two weeks.  The first few applications should be taken lying down, as mild light-headedness or a brief headache may occur.  It may take several weeks  for the fissure to begin healing, and you will need to continue taking the medication after resolution of your symptoms.  It is important to continue the treatment for the entire time period - up to 3 months or as directed. It takes up to two years for the healing tissue to regain the normal skin strength. You will be advised to add fiber to your diet, increase water intake to 7-8 glasses per day, take relaxing baths or sitz baths, and avoid prolonged sitting and straining on the commode. Local anesthetic ointment may be added.  Initially, the anal fissure is very inflamed, which  allows more of the NTG to get into the blood. This allows for a higher incidence of the most common side effect - a headache. It is usually brief and mild, but may require Tylenol or Advil. You may dilute the NTG further with Vaseline to decrease the headaches. As the treatment progresses and the fissure begins to heal, the headaches will tend to dissipate. Other side effects include lightheadedness, flushing, dizziness, nervousness, nausea, and vomiting. If any of these side effects persist or worsen, notify us promptly. Stop using the NTG and notify us immediately if you develop the rare side effects of severe dizziness, fainting, fast/pounding heartbeat, paleness, sweating, blurred vision, dry mouth, dark urine, bluish lips/skin/nails, unusual tiredness, severe weakness, irregular heartbeat, seizures, or chest pain. Serious allergic reactions are unusual, but seek immediate medical attention if you develop a rash, swelling, dizziness, or trouble breathing.  Tell us if you are allergic to nitrates, have severe anemia, low blood pressure, dehydration, chronic heart failure, cardiomyopathy, recent heart attack, increased pressure in the brain, or exposure to nitrates while on the job. Do not use NTG while driving or working around machinery if you are drowsy, dizzy, have lightheadedness, or blurred vision. Limit alcoholic beverages. To minimize dizziness and lightheadedness, get up slowly when rising from a sitting or lying position. The elderly may be more prone to dizziness and falling. While there are not adequate studies to confirm the safety of NTG in pregnant or breast feeding women, it has been used without incident so far. We recommend waiting at least one hour after applying the NTG ointment before breast feeding.  Do not use NTG ointment if you are taking drugs for sexual problems [e.g., sildenafil (Viagra), tadalafil (Cialis), vardenafil (Levitra)]. Use caution before taking cough-and-cold  products, diet aids, or NSAIDs preparations because they may contain ingredients that could increase your blood pressure, cause a fast heartbeat, or increase chest pain (e.g., pseudoephedrine, phenylephrine, chlorpheniramine, diphenhydramine, clemastine, ibuprofen, and naproxen). Tell us if you drink alcohol, take alteplase, migraine drugs (ergotamine), water pills/diuretics such as furosemide or hydrochlorothiazide, or other drugs for high blood pressure (beta blockers, calcium channel blockers, ACE inhibitors).  Store the NTG at room temperature and keep away from light and moisture. Close the container tightly after each use. Do not store in the bathroom. Keep away from children and pets. If you have any questions or problems please call us at  [phone].  If you are age 33 or older, your body mass index should be between 23-30. Your Body mass index is 29.39 kg/m. If this is out of the aforementioned range listed, please consider follow up with your Primary Care Provider.  If you are age 35 or younger, your body mass index should be between 19-25. Your Body mass index is 29.39 kg/m. If this is out of the aformentioned range listed, please consider follow up with your Primary  Care Provider.   We have sent the Nitro-oint to [facility].   We have given you samples of Linzess 72 mcg 1 daily for 12 days. If you are happy with the results please call the office for a prescription.  Thank you for choosing [facility]  Dr Wilfrid Lund III

## 2015-12-16 ENCOUNTER — Encounter: Payer: Self-pay | Admitting: Gastroenterology

## 2015-12-16 ENCOUNTER — Ambulatory Visit (INDEPENDENT_AMBULATORY_CARE_PROVIDER_SITE_OTHER): Payer: Managed Care, Other (non HMO) | Admitting: Gastroenterology

## 2015-12-16 VITALS — BP 138/70 | HR 68 | Ht 64.0 in | Wt 170.0 lb

## 2015-12-16 DIAGNOSIS — K649 Unspecified hemorrhoids: Secondary | ICD-10-CM | POA: Diagnosis not present

## 2015-12-16 DIAGNOSIS — K5901 Slow transit constipation: Secondary | ICD-10-CM

## 2015-12-16 MED ORDER — LINACLOTIDE 72 MCG PO CAPS: 72.0000 ug | ORAL_CAPSULE | Freq: Every day | ORAL | 0 refills | Status: DC

## 2015-12-16 NOTE — Progress Notes (Signed)
PROCEDURE NOTE: The patient presents with symptomatic grade 2 hemorrhoids, requesting rubber band ligation of his/her hemorrhoidal disease. All risks, benefits and alternative forms of therapy were described and informed consent was obtained. DRE revealed no fissure or tenderness Anoscopy revealed large RP and RA internal HR, smaller LL  The anorectum was pre-medicated with 0.125% NTG and 5% lidocaine The decision was made to band the RA internal hemorrhoids, and the Tonica was used to perform band ligation without complication. Digital anorectal examination was then performed to assure proper positioning of the band, and to adjust the banded tissue as required. The patient was discharged home without pain or other issues. Dietary and behavioral recommendations were given and along with follow-up instructions.   The following adjunctive treatments were recommended:  Linzess 72 micrograms once daily  The patient will return 4 weeks  for follow-up and possible additional banding as required. No complications were encountered and the patient tolerated the procedure well.

## 2015-12-16 NOTE — Patient Instructions (Addendum)
HEMORRHOID BANDING PROCEDURE    FOLLOW-UP CARE   1. The procedure you have had should have been relatively painless since the banding of the area involved does not have nerve endings and there is no pain sensation.  The rubber band cuts off the blood supply to the hemorrhoid and the band may fall off as soon as 48 hours after the banding (the band may occasionally be seen in the toilet bowl following a bowel movement). You may notice a temporary feeling of fullness in the rectum which should respond adequately to plain Tylenol or Motrin.  2. Following the banding, avoid strenuous exercise that evening and resume full activity the next day.  A sitz bath (soaking in a warm tub) or bidet is soothing, and can be useful for cleansing the area after bowel movements.     3. To avoid constipation, take two tablespoons of natural wheat bran, natural oat bran, flax, Benefiber or any over the counter fiber supplement and increase your water intake to 7-8 glasses daily.    4. Unless you have been prescribed anorectal medication, do not put anything inside your rectum for two weeks: No suppositories, enemas, fingers, etc.  5. Occasionally, you may have more bleeding than usual after the banding procedure.  This is often from the untreated hemorrhoids rather than the treated one.  Don't be concerned if there is a tablespoon or so of blood.  If there is more blood than this, lie flat with your bottom higher than your head and apply an ice pack to the area. If the bleeding does not stop within a half an hour or if you feel faint, call our office at [phone] or go to the emergency room.  6. Problems are not common; however, if there is a substantial amount of bleeding, severe pain, chills, fever or difficulty passing urine (very rare) or other problems, you should call us at [phone] or report to the nearest emergency room.  7. Do not stay seated continuously for more than 2-3 hours for a day or two  after the procedure.  Tighten your buttock muscles 10-15 times every two hours and take 10-15 deep breaths every 1-2 hours.  Do not spend more than a few minutes on the toilet if you cannot empty your bowel; instead re-visit the toilet at a later time.   We have sent the following medications to your pharmacy for you to pick up at your convenience:Linzess 16mcg one a day by mouth.  We have scheduled your next banding for 01-12-2016 @ 815am  Thank you for choosing [facility]  Dr Wilfrid Lund III

## 2016-01-11 ENCOUNTER — Ambulatory Visit (INDEPENDENT_AMBULATORY_CARE_PROVIDER_SITE_OTHER): Payer: Managed Care, Other (non HMO) | Admitting: Gastroenterology

## 2016-01-11 ENCOUNTER — Encounter: Payer: Managed Care, Other (non HMO) | Admitting: Gastroenterology

## 2016-01-11 ENCOUNTER — Encounter: Payer: Self-pay | Admitting: Gastroenterology

## 2016-01-11 VITALS — BP 136/80 | HR 76 | Ht 64.0 in | Wt 174.6 lb

## 2016-01-11 DIAGNOSIS — K648 Other hemorrhoids: Secondary | ICD-10-CM | POA: Diagnosis not present

## 2016-01-11 DIAGNOSIS — K5901 Slow transit constipation: Secondary | ICD-10-CM | POA: Diagnosis not present

## 2016-01-11 DIAGNOSIS — K644 Residual hemorrhoidal skin tags: Secondary | ICD-10-CM | POA: Diagnosis not present

## 2016-01-11 MED ORDER — DILTIAZEM GEL 2 %: 1.0000 "application " | Freq: Two times a day (BID) | CUTANEOUS | 0 refills | Status: AC

## 2016-01-11 NOTE — Patient Instructions (Addendum)
If you are age 58 or older, your body mass index should be between 23-30. Your Body mass index is 29.97 kg/m. If this is out of the aforementioned range listed, please consider follow up with your Primary Care Provider.  If you are age 26 or younger, your body mass index should be between 19-25. Your Body mass index is 29.97 kg/m. If this is out of the aformentioned range listed, please consider follow up with your Primary Care Provider.   Sitz bath twice a day.  Use the Diltiazem gel as directed. Prescription was sent to [facility]  If after 3 days your symptoms have not improved use preperation H (without hydrocortisone) 2-3 times a day.  Follow up as needed if hemorrhoid bleeding occurs and if you would like to pursue the other bandings.  Thank you for choosing [facility]  Dr Wilfrid Lund III

## 2016-01-11 NOTE — Progress Notes (Signed)
[facility] Progress Note  Chief Complaint: Anal pain from external hemorrhoid.  Subjective  History:  Ms. Venturino was here today for her second internal hemorrhoidal banding. However, in the last several days she has developed a painful enlarged external hemorrhoid. She recalls that it happened about 10 years ago and improved with diltiazem ointment. Her constipation has been manageable lately on her current regimen. She did improve with Linzess 72 g samples, but says that even $30 a month with a pharmacy benefit card was not worth it.  ROS: Cardiovascular:  no chest pain Respiratory: no dyspnea  The patient's Past Medical, Family and Social History were reviewed and are on file in the EMR.  Objective:  Med list reviewed  Vital signs in last 24 hrs: Vitals:   01/11/16 0829  BP: 136/80  Pulse: 76    Physical Exam    Exam was performed in the presence of our MA Patti  There is a small enlarged external hemorrhoid that is not thrombosed. It is visible and also palpable on digital rectal exam. Repeat anoscopy was performed just to make sure it was not a prolapsed internal hemorrhoid, but the right posterior internal hemorrhoids were seen separately. She seems to had a good result from the previous RA banding.    @ASSESSMENTPLANBEGIN @ Assessment: No diagnosis found.  External hemorrhoid Internal hemorrhoids Chronic constipation Plan:  I prescribed her diltiazem 2% ointment since it seemed to work for her in the past. If she is not better after 3 or 4 days, she will use OTC Preparation H ointment. She will also use sitz baths twice a day.  She has lately had no rectal bleeding, so she might not need ending of the RP internal hemorrhoids. She will let me know if she wishes to pursue that.  Kerri Whitaker

## 2016-02-02 IMAGING — CT CT ABD-PELV W/ CM
1 of 3 series · 13 of 32 positions shown, 18 images · IV contrast (OMNIPAQUE)
Comparison: None.

CLINICAL DATA: Newly diagnosed ovarian carcinoma. Recently status
post exploratory laparotomy.

EXAM:
CT CHEST, ABDOMEN, AND PELVIS WITH CONTRAST
TECHNIQUE: Multidetector CT imaging of the chest, abdomen and pelvis was
performed following the standard protocol during bolus
administration of intravenous contrast.
CONTRAST:  100mL OMNIPAQUE IOHEXOL 300 MG/ML  SOLN

[Series 2: cap w · axial · 0.76mm/px · z∈[+62,+607]mm · 13 of 123 slices shown, 18 images]
[im 7/123  soft-tissue]
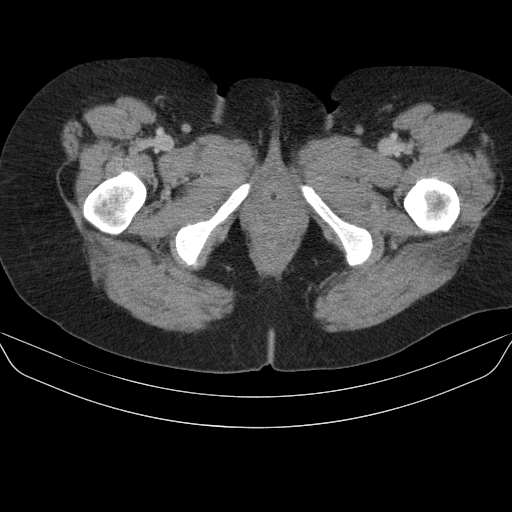
[im 7/123  bone]
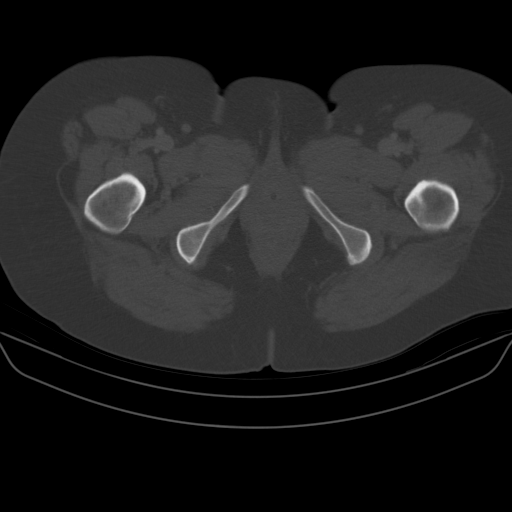
[im 21/123  soft-tissue]
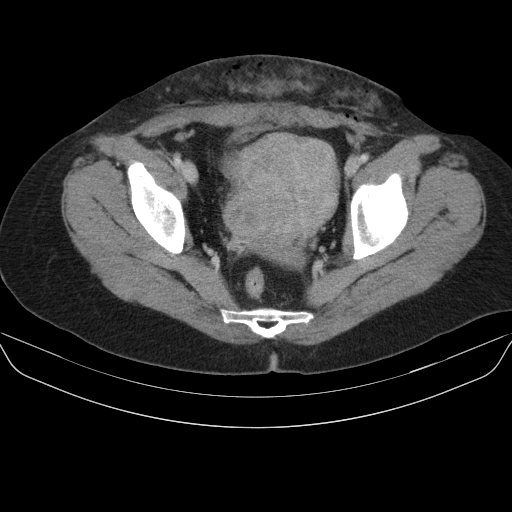
[im 28/123  soft-tissue]
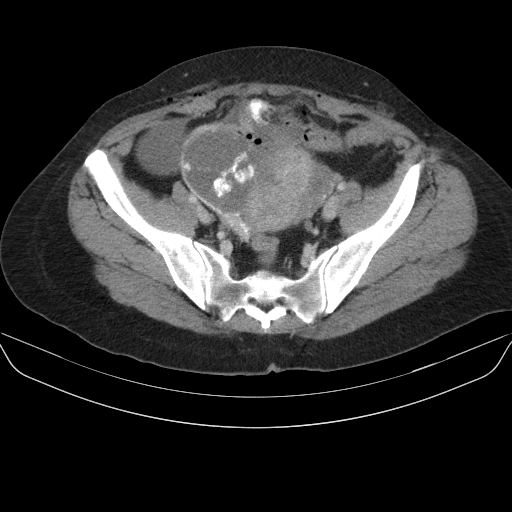
[im 34/123  soft-tissue]
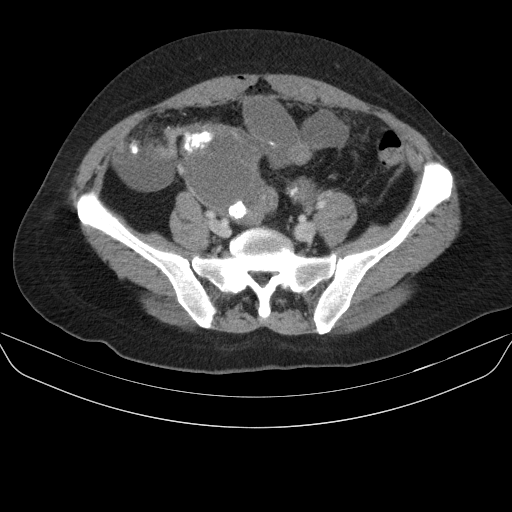
[im 48/123  soft-tissue]
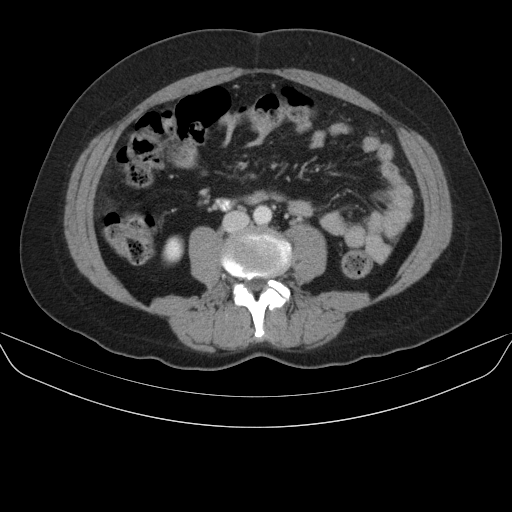
[im 55/123  soft-tissue]
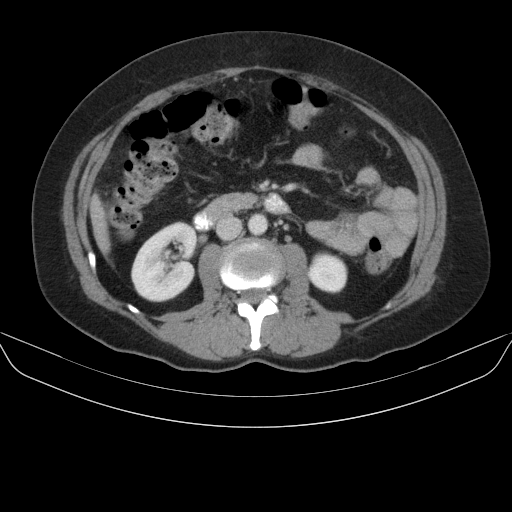
[im 68/123  soft-tissue]
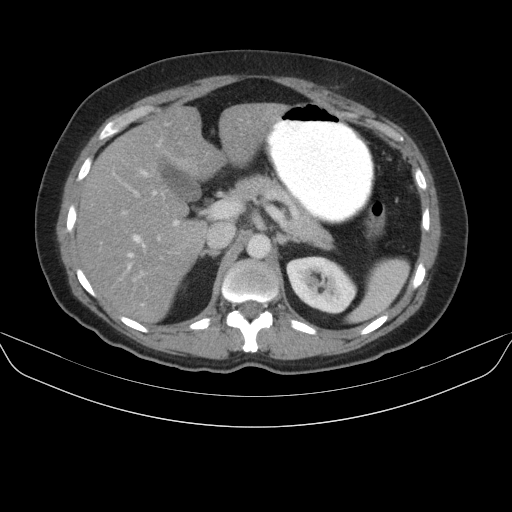
[im 75/123  soft-tissue]
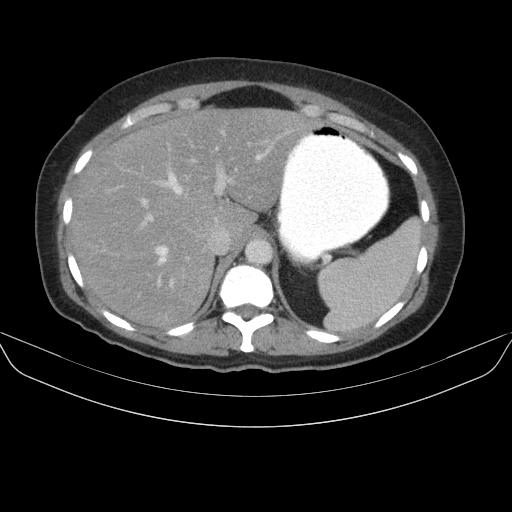
[im 89/123  soft-tissue]
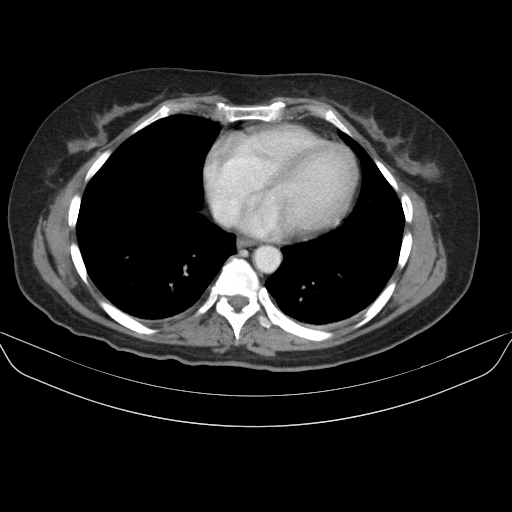
[im 89/123  bone]
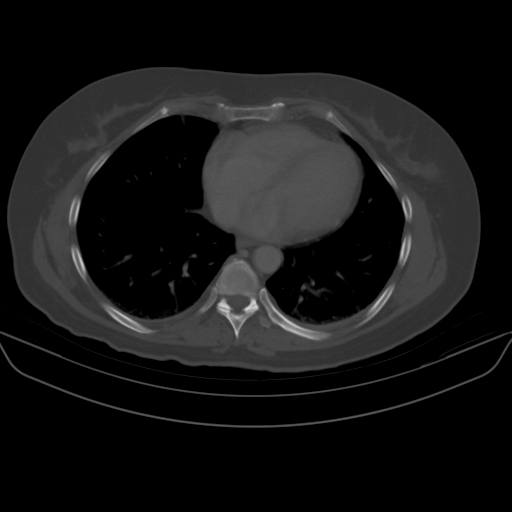
[im 95/123  soft-tissue]
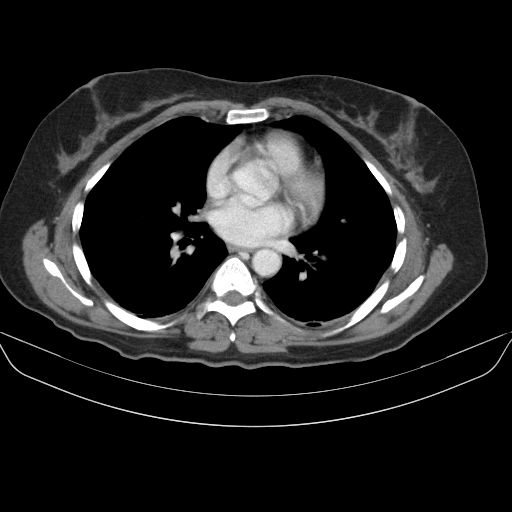
[im 95/123  lung]
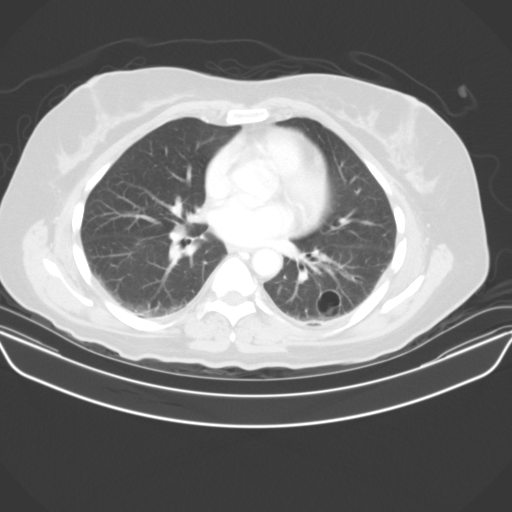
[im 102/123  soft-tissue]
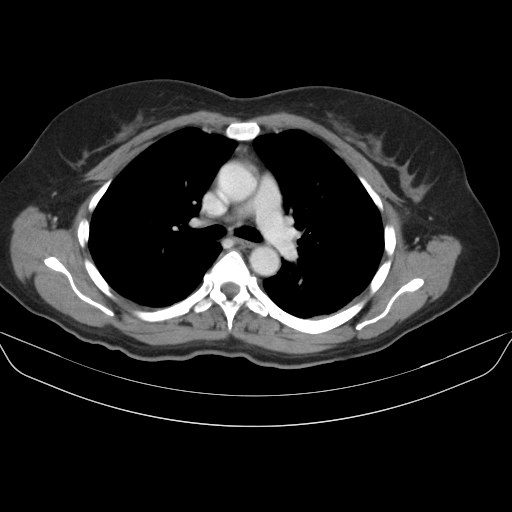
[im 102/123  lung]
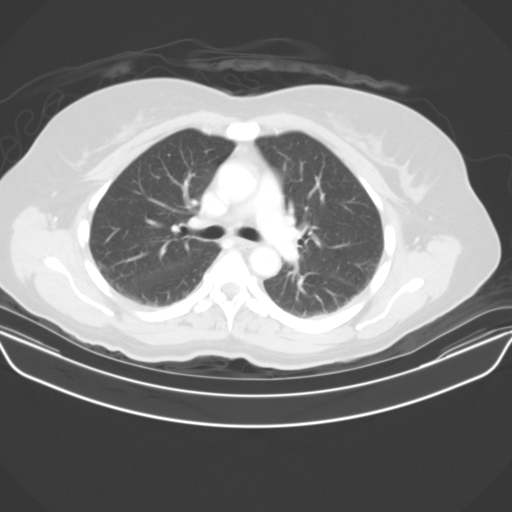
[im 109/123  lung]
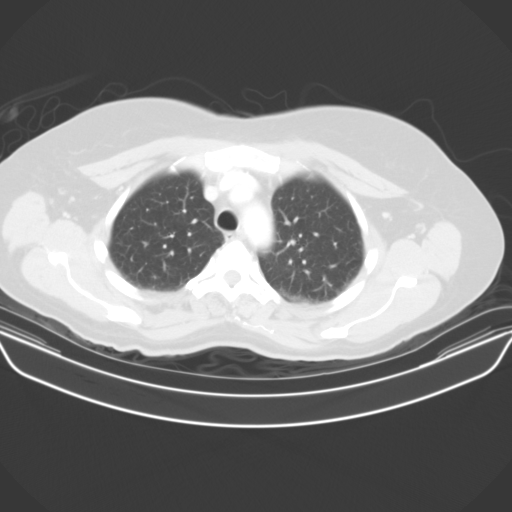
[im 116/123  soft-tissue]
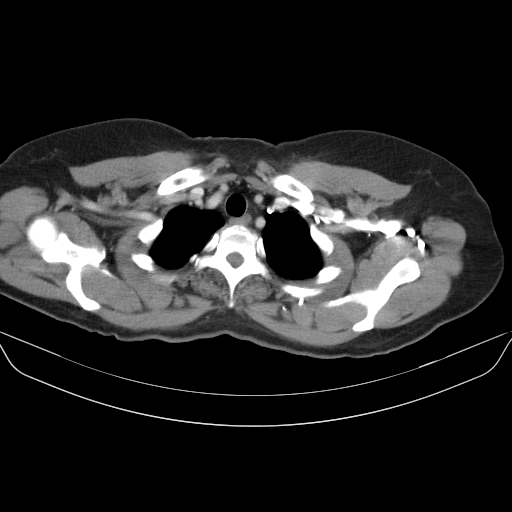
[im 116/123  lung]
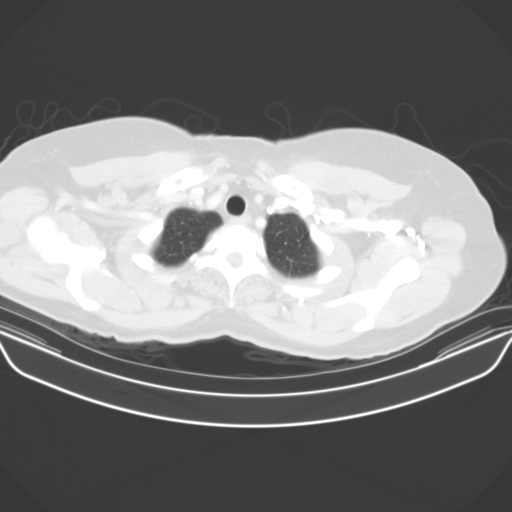

[13 of 32 positions shown; findings below may reference images not displayed]

FINDINGS: CT CHEST FINDINGS

Mediastinum/Hilar Regions: No masses or pathologically enlarged
lymph nodes identified.

Other Thoracic Lymphadenopathy:  None.

Lungs: A thin walled bullet is seen in the left lower lobe which
contains a 10 mm nodular density. This suggests a postinflammatory
etiology or mycetoma, with metastatic disease considered very
unlikely. Mild dependent atelectasis seen bilaterally.

Pleura:  No evidence of effusion or mass.

Vascular/Cardiac: No thoracic aortic aneurysm or other significant
abnormality identified.

Musculoskeletal:  No suspicious bone lesions identified.

Other:  None.

CT ABDOMEN AND PELVIS FINDINGS

Liver: Diffuse hepatic steatosis noted. No liver masses are
identified.

Gallbladder/Biliary:  Unremarkable.

Pancreas: No mass, inflammatory changes, or other parenchymal
abnormality identified.

Spleen:  Within normal limits in size and appearance.

Adrenal Glands:  No mass identified.

Kidneys/Urinary Tract: No masses identified. No evidence of
hydronephrosis. Foley catheter seen within the bladder.

Lymph Nodes: A 1.4 cm retroperitoneal lymph node is seen in the left
paraaortic region. No other pathologically enlarged lymph nodes
identified.

Pelvic/Reproductive Organs: Multiple uterine myometrial masses are
seen measuring up to 5 cm, consistent uterine fibroids. In addition,
there are complex bilateral cystic adnexal masses which show mural
nodularity and solid calcified components. The mass in the right
adnexa measures 7.8 x 7.5 cm in image 93 in the left adnexal masses
smaller measuring 3.7 x 3.7 cm also on image 93. Several other
cystic areas with calcification are seen within the lower abdominal
mesentery and there is also soft tissue nodularity involving the
omental fat in the lower abdomen. These findings are highly
suspicious for peritoneal carcinomatosis.

Bowel/Peritoneum: No evidence of bowel wall thickening, mass, or
obstruction. Small amount of free air noted, consistent recent
laparotomy.

Vascular:  No evidence of abdominal aortic aneurysm.

Musculoskeletal:  No suspicious bone lesions identified.

Other:  None.
IMPRESSION: Bilateral complex cystic and solid adnexal masses, right side larger
than left, consistent with ovarian carcinoma. Findings of peritoneal
carcinomatosis are also noted within the lower abdomen and pelvis.

Mild left paraaortic retroperitoneal lymphadenopathy, suspicious for
metastatic disease.

No definite evidence of metastatic disease within the thorax. A 10
mm nodular density is seen within a left lower lobe bulla. This is
likely due to mycetoma or other inflammatory etiology, with
metastatic disease considered unlikely. Continued followup by CT is
recommended.

Multiple uterine fibroids.

Hepatic steatosis.

## 2017-05-14 ENCOUNTER — Encounter: Payer: Self-pay | Admitting: Radiology

## 2017-08-20 ENCOUNTER — Encounter: Payer: Self-pay | Admitting: Radiology

## 2019-05-20 ENCOUNTER — Encounter: Payer: Self-pay | Admitting: Radiology

## 2021-02-24 ENCOUNTER — Encounter: Payer: Self-pay | Admitting: Gastroenterology

## 2022-08-02 ENCOUNTER — Encounter: Payer: Self-pay | Admitting: Gastroenterology
